# Patient Record
Sex: Male | Born: 1980 | Race: White | Hispanic: No | Marital: Married | Smoking: Former smoker
Health system: Southern US, Community
[De-identification: ages and names within clinical notes are randomized; demographics above are authoritative.]

---

## 2007-10-14 ENCOUNTER — Ambulatory Visit: Payer: Self-pay | Admitting: Internal Medicine

## 2007-11-09 ENCOUNTER — Ambulatory Visit (HOSPITAL_BASED_OUTPATIENT_CLINIC_OR_DEPARTMENT_OTHER): Admission: RE | Admit: 2007-11-09 | Discharge: 2007-11-09 | Payer: Self-pay | Admitting: Internal Medicine

## 2007-11-09 ENCOUNTER — Encounter: Payer: Self-pay | Admitting: Internal Medicine

## 2007-11-12 ENCOUNTER — Ambulatory Visit: Payer: Self-pay | Admitting: Internal Medicine

## 2007-11-23 ENCOUNTER — Ambulatory Visit: Payer: Self-pay | Admitting: Internal Medicine

## 2007-11-23 DIAGNOSIS — G4700 Insomnia, unspecified: Secondary | ICD-10-CM | POA: Insufficient documentation

## 2008-02-17 ENCOUNTER — Ambulatory Visit: Payer: Self-pay | Admitting: Internal Medicine

## 2008-03-08 LAB — CONVERTED CEMR LAB: Free T4: 0.9 ng/dL (ref 0.6–1.6)

## 2008-03-27 ENCOUNTER — Ambulatory Visit (HOSPITAL_BASED_OUTPATIENT_CLINIC_OR_DEPARTMENT_OTHER): Admission: RE | Admit: 2008-03-27 | Discharge: 2008-03-27 | Payer: Self-pay | Admitting: Orthopedic Surgery

## 2008-09-28 ENCOUNTER — Telehealth: Payer: Self-pay | Admitting: Internal Medicine

## 2009-03-04 ENCOUNTER — Telehealth (INDEPENDENT_AMBULATORY_CARE_PROVIDER_SITE_OTHER): Payer: Self-pay | Admitting: *Deleted

## 2009-03-28 ENCOUNTER — Ambulatory Visit: Payer: Self-pay | Admitting: Internal Medicine

## 2010-03-28 ENCOUNTER — Ambulatory Visit: Payer: Self-pay | Admitting: Internal Medicine

## 2010-08-12 NOTE — Procedures (Signed)
NAME:  Brett Rubio, Brett Rubio                 ACCOUNT NO.:  [id number]   MEDICAL RECORD NO.:  [id number]          PATIENT TYPE:  OUT   LOCATION:  SLEEP CENTER                 FACILITY:  [facility]   PHYSICIAN:  Clinton D. Maple Hudson, MD, FCCP, FACPDATE OF BIRTH:  12/08/78   DATE OF STUDY:  11/09/2007                            NOCTURNAL POLYSOMNOGRAM   REFERRING PHYSICIAN:  Clinton D. Maple Hudson, MD, FCCP, FACP   REFERRING PHYSICIAN:  Clinton D. Maple Hudson, M.D.   INDICATION FOR STUDY:  Hypersomnia with sleep apnea.   EPWORTH SLEEPINESS SCORE:  1/24.   BMP 23.7, weight 165 pounds, height 70 inches, neck 15 inches.   MEDICATIONS:  Home medications charted and reviewed.   SLEEP ARCHITECTURE:  Total sleep time 247 minutes with sleep efficiency  62.8%. Stage I was 29.4%, stage 2 was 70.6%, stage 3 and REM were  absent. Sleep latency 24 minutes. Awake after sleep onset 104 minutes.  Arousal index 26.2 indicating increased EEG arousal. The sleep profile  was marked by frequent, brief, nonspecific awakenings throughout the  night. No bedtime medication was taken.   RESPIRATORY DATA:  Apnea/hypopnea index (AHI) 0 per hour, respiratory  disturbance index (RDI) 6.6 per hour indicating minimal respiratory  impact on sleep. Respiratory events with arousals totaled 27 with an  index of 6.6 per hour.   OXYGEN DATA:  No snoring noted by the technician. Oxygen desaturation  nadir 95% with mean oxygen saturation through the study of 96.9% on room  air.   CARDIAC DATA:  Normal sinus rhythm.   MOVEMENT-PARASOMNIA:  No significant movement disturbance. Bathroom x1.   IMPRESSIONS-RECOMMENDATIONS:  1. Sleep profile was significant for frequent, nonspecific waking,      insomnia pattern.  2. Insignificant sleep disturbance related to respiratory or movement      events during sleep.      Clinton D. Maple Hudson, MD, [facility], FACP  Diplomate, Biomedical engineer of Sleep Medicine  Electronically Signed     CDY/MEDQ  D:   11/12/2007 12:49:37  T:  11/12/2007 13:09:43  Job:  [id number]

## 2010-08-12 NOTE — Op Note (Signed)
NAMEKAION, TISDALE                 ACCOUNT NO.:  [id number]   MEDICAL RECORD NO.:  [id number]          PATIENT TYPE:  AMB   LOCATION:  [facility]                         FACILITY:  [facility]   PHYSICIAN:  Deidre Ala, M.D.    DATE OF BIRTH:  1980/10/21   DATE OF PROCEDURE:  03/27/2008  DATE OF DISCHARGE:                               OPERATIVE REPORT   PREOPERATIVE DIAGNOSES:  1. Right knee medial plica syndrome.  2. Left knee medial plica syndrome.   POSTOPERATIVE DIAGNOSES:  1. Right knee medial plica syndrome.  2. Left knee medial plica syndrome.   PROCEDURE:  1. Right knee arthroscopy with excision of medial plica.  2. Left knee arthroscopy with debridement of medial plica.   SURGEON:  1. Charlesetta Shanks, M.D.   ASSISTANT:  Phineas Semen, P.A.   ANESTHESIA:  General with LMA.   CULTURES:  None.   DRAINS:  None.   ESTIMATED BLOOD LOSS:  Minimal.   TOURNIQUET TIME:  Right: 12 minutes.  Left: 12 minutes.   PATHOLOGIC FINDINGS AND HISTORY:  Antuane does a lot of walking and some  running.  He does a lot of lifting and squatting.  He presented to [place]  about 14 months ago with knee pain.  We made the diagnosis of bilateral  plica with mild patella alta bilaterally with tenderness right over  plicas bilaterally.  We injected the knee with cortisone and Marcaine.  We saw him back February 16, 2007.  He has greatly improved with the  injection and so he made good progress, put him on ibuprofen.  By November 18, 2007 he came back in.  He is living with his plicas level V.  They  were still bothering him, not terribly aggravating, but they became more  bothersome over the last several months in the cold with walking.  On  exam today preop he was tender over bilateral plicas.  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  Therefore, at surgery today  on both sides we found sharp crescentic fibrous plica bands exactly  corresponding to his areas of maximal tenderness.  We  debrided those to  a sidewall and lysed the medial band.  There was some gutter synovitis,  mild chondromalacia of patella.  The ACLs looked good.  The menisci  looked good on both sides.  Plicas were resected.   PROCEDURE:  With adequate anesthesia obtained using LMA technique, 1  gram Ancef given IV prophylaxis, the patient was placed in the supine  position.  Both lower extremities were prepped from the malleoli to the  leg holders in the standard fashion.  After standard prepping and  draping on the right knee, Esmarch examination was used.  The tourniquet  was let up to 350 mmHg.  Superior lateral inflow portal was made.  The  knee was insufflated with normal saline with arthroscopic pump.  Medial  and lateral scope portals were then made.  The joint was thoroughly  inspected.  I then shaved the medial plica back to the sidewall and  lysed the medial band.  I then checked the menisci and the ACL, shaved  out the ligamentum mucosa.  I reversed the portals and shaved out the  lateral gutter synovitis and checked the joint surface under the  patella.  The knee was then irrigated through the scope, 0.5% Marcaine  injected on the left knee only.  Right bulky sterile compressive  dressing was applied.  The tourniquet was let down and attention was  turned the left knee where the exact same procedure was performed.  The  patient then had a dressing placed there having procedure well, was  awakened, taken to the recovery room in satisfactory condition.  He will  be discharged per outpatient routine.  Given EZ wraps, Percocet for  pain, crutches and told call the office for recheck tomorrow.      Deidre Ala, M.D.  Electronically Signed     VEP/MEDQ  D:  03/27/2008  T:  03/28/2008  Job:  [id number]

## 2011-01-02 LAB — POCT HEMOGLOBIN-HEMACUE: Hemoglobin: 16.3 g/dL (ref 13.0–17.0)

## 2015-11-10 DIAGNOSIS — K219 Gastro-esophageal reflux disease without esophagitis: Secondary | ICD-10-CM

## 2015-11-10 HISTORY — DX: Gastro-esophageal reflux disease without esophagitis: K21.9

## 2015-11-11 DIAGNOSIS — K5090 Crohn's disease, unspecified, without complications: Secondary | ICD-10-CM | POA: Insufficient documentation

## 2015-11-11 DIAGNOSIS — Z8619 Personal history of other infectious and parasitic diseases: Secondary | ICD-10-CM | POA: Insufficient documentation

## 2015-11-11 HISTORY — DX: Personal history of other infectious and parasitic diseases: Z86.19

## 2017-05-27 DIAGNOSIS — Z6823 Body mass index (BMI) 23.0-23.9, adult: Secondary | ICD-10-CM | POA: Diagnosis not present

## 2017-05-27 DIAGNOSIS — Z13220 Encounter for screening for lipoid disorders: Secondary | ICD-10-CM | POA: Diagnosis not present

## 2017-05-27 DIAGNOSIS — Z131 Encounter for screening for diabetes mellitus: Secondary | ICD-10-CM | POA: Diagnosis not present

## 2017-05-27 DIAGNOSIS — Z0000 Encounter for general adult medical examination without abnormal findings: Secondary | ICD-10-CM | POA: Diagnosis not present

## 2017-05-27 DIAGNOSIS — Z1331 Encounter for screening for depression: Secondary | ICD-10-CM | POA: Diagnosis not present

## 2017-06-09 DIAGNOSIS — R197 Diarrhea, unspecified: Secondary | ICD-10-CM | POA: Diagnosis not present

## 2017-06-22 DIAGNOSIS — K529 Noninfective gastroenteritis and colitis, unspecified: Secondary | ICD-10-CM | POA: Diagnosis not present

## 2017-06-22 DIAGNOSIS — K625 Hemorrhage of anus and rectum: Secondary | ICD-10-CM | POA: Diagnosis not present

## 2017-07-26 DIAGNOSIS — K625 Hemorrhage of anus and rectum: Secondary | ICD-10-CM | POA: Diagnosis not present

## 2017-07-26 DIAGNOSIS — K5289 Other specified noninfective gastroenteritis and colitis: Secondary | ICD-10-CM | POA: Diagnosis not present

## 2017-07-29 DIAGNOSIS — K5289 Other specified noninfective gastroenteritis and colitis: Secondary | ICD-10-CM | POA: Diagnosis not present

## 2018-01-05 DIAGNOSIS — K5100 Ulcerative (chronic) pancolitis without complications: Secondary | ICD-10-CM | POA: Diagnosis not present

## 2018-05-16 DIAGNOSIS — J0100 Acute maxillary sinusitis, unspecified: Secondary | ICD-10-CM | POA: Diagnosis not present

## 2018-05-16 DIAGNOSIS — R030 Elevated blood-pressure reading, without diagnosis of hypertension: Secondary | ICD-10-CM | POA: Diagnosis not present

## 2018-10-24 DIAGNOSIS — K5100 Ulcerative (chronic) pancolitis without complications: Secondary | ICD-10-CM | POA: Diagnosis not present

## 2018-10-24 DIAGNOSIS — K5010 Crohn's disease of large intestine without complications: Secondary | ICD-10-CM | POA: Diagnosis not present

## 2019-08-10 DIAGNOSIS — K51011 Ulcerative (chronic) pancolitis with rectal bleeding: Secondary | ICD-10-CM | POA: Diagnosis not present

## 2019-08-10 DIAGNOSIS — E559 Vitamin D deficiency, unspecified: Secondary | ICD-10-CM | POA: Diagnosis not present

## 2019-08-10 DIAGNOSIS — R7989 Other specified abnormal findings of blood chemistry: Secondary | ICD-10-CM | POA: Diagnosis not present

## 2019-09-01 DIAGNOSIS — Z1159 Encounter for screening for other viral diseases: Secondary | ICD-10-CM | POA: Diagnosis not present

## 2019-09-04 DIAGNOSIS — R899 Unspecified abnormal finding in specimens from other organs, systems and tissues: Secondary | ICD-10-CM | POA: Diagnosis not present

## 2019-09-06 DIAGNOSIS — K648 Other hemorrhoids: Secondary | ICD-10-CM | POA: Diagnosis not present

## 2019-09-06 DIAGNOSIS — K5100 Ulcerative (chronic) pancolitis without complications: Secondary | ICD-10-CM | POA: Diagnosis not present

## 2019-09-06 DIAGNOSIS — K644 Residual hemorrhoidal skin tags: Secondary | ICD-10-CM | POA: Diagnosis not present

## 2019-09-06 DIAGNOSIS — K5289 Other specified noninfective gastroenteritis and colitis: Secondary | ICD-10-CM | POA: Diagnosis not present

## 2019-09-06 LAB — HM COLONOSCOPY

## 2019-09-07 ENCOUNTER — Other Ambulatory Visit: Payer: Self-pay | Admitting: Gastroenterology

## 2019-09-07 DIAGNOSIS — R945 Abnormal results of liver function studies: Secondary | ICD-10-CM

## 2019-09-07 DIAGNOSIS — R7989 Other specified abnormal findings of blood chemistry: Secondary | ICD-10-CM

## 2019-09-15 ENCOUNTER — Ambulatory Visit
Admission: RE | Admit: 2019-09-15 | Discharge: 2019-09-15 | Disposition: A | Discharge status: Home / Self Care | Payer: BC Managed Care – PPO | Source: Ambulatory Visit | Attending: Gastroenterology | Admitting: Gastroenterology

## 2019-09-15 DIAGNOSIS — R7989 Other specified abnormal findings of blood chemistry: Secondary | ICD-10-CM

## 2019-09-15 DIAGNOSIS — K760 Fatty (change of) liver, not elsewhere classified: Secondary | ICD-10-CM | POA: Diagnosis not present

## 2019-09-15 DIAGNOSIS — R945 Abnormal results of liver function studies: Secondary | ICD-10-CM

## 2019-10-06 DIAGNOSIS — R7989 Other specified abnormal findings of blood chemistry: Secondary | ICD-10-CM | POA: Diagnosis not present

## 2019-10-20 DIAGNOSIS — Z23 Encounter for immunization: Secondary | ICD-10-CM | POA: Diagnosis not present

## 2019-10-20 DIAGNOSIS — F900 Attention-deficit hyperactivity disorder, predominantly inattentive type: Secondary | ICD-10-CM | POA: Diagnosis not present

## 2019-10-20 DIAGNOSIS — F1010 Alcohol abuse, uncomplicated: Secondary | ICD-10-CM | POA: Diagnosis not present

## 2019-10-20 DIAGNOSIS — R41840 Attention and concentration deficit: Secondary | ICD-10-CM | POA: Diagnosis not present

## 2019-11-20 DIAGNOSIS — Z03818 Encounter for observation for suspected exposure to other biological agents ruled out: Secondary | ICD-10-CM | POA: Diagnosis not present

## 2019-11-20 DIAGNOSIS — Z20822 Contact with and (suspected) exposure to covid-19: Secondary | ICD-10-CM | POA: Diagnosis not present

## 2019-12-10 ENCOUNTER — Other Ambulatory Visit: Payer: Self-pay

## 2019-12-10 ENCOUNTER — Ambulatory Visit: Payer: BC Managed Care – PPO | Attending: Internal Medicine

## 2019-12-10 DIAGNOSIS — Z23 Encounter for immunization: Secondary | ICD-10-CM

## 2019-12-10 NOTE — Progress Notes (Signed)
   Covid-19 Vaccination Clinic  Name:  Brett Rubio    MRN: [id number] DOB: 01-Feb-1981  12/10/2019  Brett Rubio was observed post Covid-19 immunization for 15 minutes without incident. He was provided with Vaccine Information Sheet and instruction to access the V-Safe system.   Brett Rubio was instructed to call 911 with any severe reactions post vaccine: Marland Kitchen Difficulty breathing  . Swelling of face and throat  . A fast heartbeat  . A bad rash all over body  . Dizziness and weakness

## 2020-02-05 DIAGNOSIS — J0100 Acute maxillary sinusitis, unspecified: Secondary | ICD-10-CM | POA: Diagnosis not present

## 2020-05-29 DIAGNOSIS — K760 Fatty (change of) liver, not elsewhere classified: Secondary | ICD-10-CM | POA: Diagnosis not present

## 2020-05-29 DIAGNOSIS — K5100 Ulcerative (chronic) pancolitis without complications: Secondary | ICD-10-CM | POA: Diagnosis not present

## 2020-06-26 DIAGNOSIS — R945 Abnormal results of liver function studies: Secondary | ICD-10-CM | POA: Diagnosis not present

## 2020-06-26 DIAGNOSIS — R718 Other abnormality of red blood cells: Secondary | ICD-10-CM | POA: Diagnosis not present

## 2020-06-26 DIAGNOSIS — R7989 Other specified abnormal findings of blood chemistry: Secondary | ICD-10-CM | POA: Diagnosis not present

## 2020-09-11 DIAGNOSIS — Z20822 Contact with and (suspected) exposure to covid-19: Secondary | ICD-10-CM | POA: Diagnosis not present

## 2020-09-12 DIAGNOSIS — U071 COVID-19: Secondary | ICD-10-CM | POA: Diagnosis not present

## 2020-09-12 DIAGNOSIS — R059 Cough, unspecified: Secondary | ICD-10-CM | POA: Diagnosis not present

## 2020-09-12 DIAGNOSIS — J029 Acute pharyngitis, unspecified: Secondary | ICD-10-CM | POA: Diagnosis not present

## 2020-09-12 DIAGNOSIS — R0981 Nasal congestion: Secondary | ICD-10-CM | POA: Diagnosis not present

## 2020-10-23 ENCOUNTER — Other Ambulatory Visit: Payer: Self-pay | Admitting: Gastroenterology

## 2020-10-23 DIAGNOSIS — K5100 Ulcerative (chronic) pancolitis without complications: Secondary | ICD-10-CM

## 2020-10-23 DIAGNOSIS — R634 Abnormal weight loss: Secondary | ICD-10-CM | POA: Diagnosis not present

## 2020-10-23 DIAGNOSIS — R112 Nausea with vomiting, unspecified: Secondary | ICD-10-CM | POA: Diagnosis not present

## 2020-10-23 DIAGNOSIS — K760 Fatty (change of) liver, not elsewhere classified: Secondary | ICD-10-CM | POA: Diagnosis not present

## 2020-11-06 DIAGNOSIS — R945 Abnormal results of liver function studies: Secondary | ICD-10-CM | POA: Diagnosis not present

## 2020-11-09 DIAGNOSIS — B370 Candidal stomatitis: Secondary | ICD-10-CM | POA: Diagnosis not present

## 2020-11-09 DIAGNOSIS — R030 Elevated blood-pressure reading, without diagnosis of hypertension: Secondary | ICD-10-CM | POA: Diagnosis not present

## 2020-11-09 DIAGNOSIS — J029 Acute pharyngitis, unspecified: Secondary | ICD-10-CM | POA: Diagnosis not present

## 2020-11-25 ENCOUNTER — Ambulatory Visit
Admission: RE | Admit: 2020-11-25 | Discharge: 2020-11-25 | Disposition: A | Discharge status: Home / Self Care | Payer: BC Managed Care – PPO | Source: Ambulatory Visit | Attending: Gastroenterology | Admitting: Gastroenterology

## 2020-11-25 ENCOUNTER — Other Ambulatory Visit: Payer: Self-pay

## 2020-11-25 DIAGNOSIS — N3289 Other specified disorders of bladder: Secondary | ICD-10-CM | POA: Diagnosis not present

## 2020-11-25 DIAGNOSIS — K5100 Ulcerative (chronic) pancolitis without complications: Secondary | ICD-10-CM | POA: Diagnosis not present

## 2020-11-25 DIAGNOSIS — K760 Fatty (change of) liver, not elsewhere classified: Secondary | ICD-10-CM | POA: Diagnosis not present

## 2020-11-25 DIAGNOSIS — K5190 Ulcerative colitis, unspecified, without complications: Secondary | ICD-10-CM | POA: Diagnosis not present

## 2020-11-25 MED ORDER — IOPAMIDOL (ISOVUE-300) INJECTION 61%
100.0000 mL | Freq: Once | INTRAVENOUS | Status: AC | PRN
  Administered 2020-11-25: 100 mL via INTRAVENOUS

## 2020-12-06 DIAGNOSIS — R899 Unspecified abnormal finding in specimens from other organs, systems and tissues: Secondary | ICD-10-CM | POA: Diagnosis not present

## 2020-12-30 DIAGNOSIS — R899 Unspecified abnormal finding in specimens from other organs, systems and tissues: Secondary | ICD-10-CM | POA: Diagnosis not present

## 2020-12-30 DIAGNOSIS — H16012 Central corneal ulcer, left eye: Secondary | ICD-10-CM | POA: Diagnosis not present

## 2020-12-30 DIAGNOSIS — H179 Unspecified corneal scar and opacity: Secondary | ICD-10-CM | POA: Diagnosis not present

## 2021-01-02 DIAGNOSIS — Z125 Encounter for screening for malignant neoplasm of prostate: Secondary | ICD-10-CM | POA: Diagnosis not present

## 2021-01-02 DIAGNOSIS — N3289 Other specified disorders of bladder: Secondary | ICD-10-CM | POA: Diagnosis not present

## 2021-01-06 DIAGNOSIS — H179 Unspecified corneal scar and opacity: Secondary | ICD-10-CM | POA: Diagnosis not present

## 2021-01-17 ENCOUNTER — Other Ambulatory Visit: Payer: Self-pay | Admitting: Ophthalmology

## 2021-01-17 DIAGNOSIS — H4922 Sixth [abducent] nerve palsy, left eye: Secondary | ICD-10-CM

## 2021-02-06 DIAGNOSIS — H179 Unspecified corneal scar and opacity: Secondary | ICD-10-CM | POA: Diagnosis not present

## 2021-02-24 DIAGNOSIS — H179 Unspecified corneal scar and opacity: Secondary | ICD-10-CM | POA: Diagnosis not present

## 2021-04-04 DIAGNOSIS — H179 Unspecified corneal scar and opacity: Secondary | ICD-10-CM | POA: Diagnosis not present

## 2021-05-20 DIAGNOSIS — H179 Unspecified corneal scar and opacity: Secondary | ICD-10-CM | POA: Diagnosis not present

## 2021-07-14 DIAGNOSIS — R112 Nausea with vomiting, unspecified: Secondary | ICD-10-CM | POA: Diagnosis not present

## 2021-07-14 DIAGNOSIS — R748 Abnormal levels of other serum enzymes: Secondary | ICD-10-CM | POA: Diagnosis not present

## 2021-07-14 DIAGNOSIS — K700 Alcoholic fatty liver: Secondary | ICD-10-CM | POA: Diagnosis not present

## 2021-07-14 DIAGNOSIS — K5190 Ulcerative colitis, unspecified, without complications: Secondary | ICD-10-CM | POA: Diagnosis not present

## 2021-09-23 DIAGNOSIS — H179 Unspecified corneal scar and opacity: Secondary | ICD-10-CM | POA: Diagnosis not present

## 2021-11-03 DIAGNOSIS — G4089 Other seizures: Secondary | ICD-10-CM | POA: Diagnosis not present

## 2021-11-03 DIAGNOSIS — F319 Bipolar disorder, unspecified: Secondary | ICD-10-CM | POA: Diagnosis not present

## 2021-11-03 DIAGNOSIS — Z8616 Personal history of COVID-19: Secondary | ICD-10-CM | POA: Diagnosis not present

## 2021-11-03 DIAGNOSIS — G4700 Insomnia, unspecified: Secondary | ICD-10-CM | POA: Diagnosis not present

## 2021-11-03 DIAGNOSIS — F1020 Alcohol dependence, uncomplicated: Secondary | ICD-10-CM | POA: Diagnosis not present

## 2021-11-03 DIAGNOSIS — K5090 Crohn's disease, unspecified, without complications: Secondary | ICD-10-CM | POA: Diagnosis not present

## 2021-11-03 DIAGNOSIS — F329 Major depressive disorder, single episode, unspecified: Secondary | ICD-10-CM | POA: Diagnosis not present

## 2021-11-03 DIAGNOSIS — B009 Herpesviral infection, unspecified: Secondary | ICD-10-CM | POA: Diagnosis not present

## 2021-11-03 DIAGNOSIS — R030 Elevated blood-pressure reading, without diagnosis of hypertension: Secondary | ICD-10-CM | POA: Diagnosis not present

## 2021-11-03 DIAGNOSIS — F419 Anxiety disorder, unspecified: Secondary | ICD-10-CM | POA: Diagnosis not present

## 2021-11-03 DIAGNOSIS — Z87820 Personal history of traumatic brain injury: Secondary | ICD-10-CM | POA: Diagnosis not present

## 2021-11-03 DIAGNOSIS — R7401 Elevation of levels of liver transaminase levels: Secondary | ICD-10-CM | POA: Diagnosis not present

## 2021-11-12 DIAGNOSIS — K5190 Ulcerative colitis, unspecified, without complications: Secondary | ICD-10-CM | POA: Diagnosis not present

## 2021-11-12 DIAGNOSIS — F1010 Alcohol abuse, uncomplicated: Secondary | ICD-10-CM | POA: Diagnosis not present

## 2021-11-26 ENCOUNTER — Encounter: Payer: Self-pay | Admitting: Internal Medicine

## 2021-11-26 ENCOUNTER — Ambulatory Visit: Payer: BC Managed Care – PPO | Admitting: Internal Medicine

## 2021-11-26 VITALS — BP 114/78 | HR 57 | Temp 97.7°F | Resp 14 | Ht 69.0 in | Wt 161.0 lb

## 2021-11-26 DIAGNOSIS — F10982 Alcohol use, unspecified with alcohol-induced sleep disorder: Secondary | ICD-10-CM | POA: Diagnosis not present

## 2021-11-26 DIAGNOSIS — F10282 Alcohol dependence with alcohol-induced sleep disorder: Secondary | ICD-10-CM | POA: Diagnosis not present

## 2021-11-26 DIAGNOSIS — Z0000 Encounter for general adult medical examination without abnormal findings: Secondary | ICD-10-CM

## 2021-11-26 DIAGNOSIS — Z119 Encounter for screening for infectious and parasitic diseases, unspecified: Secondary | ICD-10-CM | POA: Diagnosis not present

## 2021-11-26 DIAGNOSIS — F1020 Alcohol dependence, uncomplicated: Secondary | ICD-10-CM

## 2021-11-26 DIAGNOSIS — F1091 Alcohol use, unspecified, in remission: Secondary | ICD-10-CM | POA: Insufficient documentation

## 2021-11-26 HISTORY — DX: Alcohol dependence, uncomplicated: F10.20

## 2021-11-26 LAB — LIPID PANEL
Cholesterol: 188 mg/dL (ref 0–200)
HDL: 51.2 mg/dL (ref >39.00)
LDL Cholesterol: 123 mg/dL — ABNORMAL HIGH (ref 0–99)
NonHDL: 136.84
Total CHOL/HDL Ratio: 4
Triglycerides: 67 mg/dL (ref 0.0–149.0)
VLDL: 13.4 mg/dL (ref 0.0–40.0)

## 2021-11-26 LAB — COMPREHENSIVE METABOLIC PANEL
ALT: 15 U/L (ref 0–53)
AST: 19 U/L (ref 0–37)
Albumin: 4.5 g/dL (ref 3.5–5.2)
Alkaline Phosphatase: 74 U/L (ref 39–117)
BUN: 10 mg/dL (ref 6–23)
CO2: 26 mEq/L (ref 19–32)
Calcium: 10 mg/dL (ref 8.4–10.5)
Chloride: 102 mEq/L (ref 96–112)
Creatinine, Ser: 0.97 mg/dL (ref 0.40–1.50)
GFR: 95.96 mL/min (ref >60.00)
Glucose, Bld: 80 mg/dL (ref 70–99)
Potassium: 4.2 mEq/L (ref 3.5–5.1)
Sodium: 137 mEq/L (ref 135–145)
Total Bilirubin: 0.6 mg/dL (ref 0.2–1.2)
Total Protein: 7.7 g/dL (ref 6.0–8.3)

## 2021-11-26 LAB — CBC WITH DIFFERENTIAL/PLATELET
Basophils Absolute: 0.1 10*3/uL (ref 0.0–0.1)
Basophils Relative: 3 % (ref 0.0–3.0)
Eosinophils Absolute: 0.1 10*3/uL (ref 0.0–0.7)
Eosinophils Relative: 3 % (ref 0.0–5.0)
HCT: 42.5 % (ref 39.0–52.0)
Hemoglobin: 14.6 g/dL (ref 13.0–17.0)
Lymphocytes Relative: 19.4 % (ref 12.0–46.0)
Lymphs Abs: 1 10*3/uL (ref 0.7–4.0)
MCHC: 34.3 g/dL (ref 30.0–36.0)
MCV: 102.3 fl — ABNORMAL HIGH (ref 78.0–100.0)
Monocytes Absolute: 0.5 10*3/uL (ref 0.1–1.0)
Monocytes Relative: 10.7 % (ref 3.0–12.0)
Neutro Abs: 3.2 10*3/uL (ref 1.4–7.7)
Neutrophils Relative %: 63.9 % (ref 43.0–77.0)
Platelets: 335 10*3/uL (ref 150.0–400.0)
RBC: 4.16 Mil/uL — ABNORMAL LOW (ref 4.22–5.81)
RDW: 12.4 % (ref 11.5–15.5)
WBC: 4.9 10*3/uL (ref 4.0–10.5)

## 2021-11-26 MED ORDER — GABAPENTIN 300 MG PO CAPS: 300.0000 mg | ORAL_CAPSULE | Freq: Three times a day (TID) | ORAL | 3 refills | Status: DC

## 2021-11-26 MED ORDER — TRAZODONE HCL 50 MG PO TABS: 50.0000 mg | ORAL_TABLET | Freq: Every evening | ORAL | 3 refills | Status: DC | PRN

## 2021-11-26 NOTE — Patient Instructions (Signed)
It was a pleasure seeing you today!  Today the plan is...  Preventative health care -     CBC with Differential/Platelet -     Comprehensive metabolic panel -     Lipid panel  Screening examination for infectious disease -     HIV Antibody (routine testing w rflx) -     Hepatitis C antibody  Alcohol dependence with alcohol-induced sleep disorder [facility]) Assessment & Plan: Chronic, stable in remission Discussed need for maintenance therapy Advised participate in 12 steps, and try to get a sponsor Uses trazodone for sleep offfered and discussed:  gabapentin  campral  naltrexone had nausea.  disulfiram   Orders: -     Gabapentin; Take 1 capsule (300 mg total) by mouth 3 (three) times daily.  Dispense: 90 capsule; Refill: 3 -     traZODone HCl; Take 1 tablet (50 mg total) by mouth at bedtime as needed.  Dispense: 90 tablet; Refill: 3  Alcohol-induced insomnia (HCC) Assessment & Plan: rf'd trazodone at patient request Chronic stable       Brett Olszewski, MD   Return in about 1 month (around 12/27/2021) for review response to recent changes.    - Please bring all your medicines to your next appointment. This is the best way for me to know exactly what you're taking.  - If your condition begins to worsen or become severe:  go to the ER. - If your condition fails to resolve or you have other questions / concerns: please contact me via phone [phone] or MyChart messaging.     IF you received an x-ray today, you will receive an invoice from [facility]. Please contact [facility] at [phone] with questions or concerns regarding your invoice.    IF you received labwork today, you will receive an invoice from [facility]. Please contact [facility] at [phone] with questions or concerns regarding your invoice.    Our billing staff will not be able to assist you with questions regarding bills from these companies.   You will be contacted with the lab  results as soon as they are available. The fastest way to get your results is to activate your My Chart account. Instructions are located on the last page of this paperwork. If you have not heard from [place] regarding the results in 2 weeks, please contact this office. For any labs or imaging tests, we will call you if the results are significantly abnormal.  Most normal results will be posted to myChart as soon as they are available and I will comment on them there within 2-3 business days.

## 2021-11-26 NOTE — Progress Notes (Signed)
Today's healthcare provider: Lula Olszewski, MD  Phone: [phone]  New patient visit  Visit Date: 11/26/2021 Patient: Brett Rubio   DOB: Mar 08, 1981   42 y.o. Male  MRN: [id number]  Assessment and Plan:   Arius was seen today for establish care.  Preventative health care -     CBC with Differential/Platelet -     Comprehensive metabolic panel -     Lipid panel  Screening examination for infectious disease -     HIV Antibody (routine testing w rflx) -     Hepatitis C antibody  Alcohol dependence with alcohol-induced sleep disorder (HCC) Overview: History of delirium tremens Did medical detox with librium/trazodone, at [facility]. Not a fan of 12 steps   Assessment & Plan: Chronic, stable in remission Discussed need for maintenance therapy Advised participate in 12 steps, and try to get a sponsor Uses trazodone for sleep offfered and discussed:  gabapentin  campral  naltrexone had nausea.  disulfiram   Orders: -     Gabapentin; Take 1 capsule (300 mg total) by mouth 3 (three) times daily.  Dispense: 90 capsule; Refill: 3 -     traZODone HCl; Take 1 tablet (50 mg total) by mouth at bedtime as needed.  Dispense: 90 tablet; Refill: 3  Alcohol-induced insomnia (HCC) Overview: Alcohol related Trazodone helps a lot Major source of triggering for alcohol use   Assessment & Plan: rf'd trazodone at patient request Chronic stable        Health Maintenance  Topic Date Due   HIV Screening  Never done   Hepatitis C Screening  Never done   TETANUS/TDAP  Never done   COVID-19 Vaccine  Completed   HPV VACCINES  Aged Out   INFLUENZA VACCINE  Discontinued       Recommended follow up: Return in about 1 month (around 12/27/2021) for review response to recent changes.      Subjective:  Patient presents today to establish care.  Comes to establish primary care as part of recommendations from recent rehab.  Needs med rf's too.   Has stayed sober ever since  leaving 3 weeks ago- 5 day rehab for alcohol with history of d/t.  Chief Complaint  Patient presents with   Establish Care    Wants to have medication filled, was in a detox program three weeks ago. Fasting today.    For history taking, I took a per problem history from the patient and chart review as follows: Problem  Alcohol Dependence (Hcc)   History of delirium tremens Did medical detox with librium/trazodone, at [facility]. Not a fan of 12 steps    INSOMNIA   Alcohol related Trazodone helps a lot Major source of triggering for alcohol use       Depression Screen    11/26/2021    9:08 AM  PHQ 2/9 Scores  PHQ - 2 Score 0   No results found for any visits on 11/26/21.   The following were reviewed and entered/updated in epic: Past Medical History:  Diagnosis Date   Alcohol dependence (HCC) 11/26/2021   History of delirium tremens Did medical detox with librium/trazodone, at [facility]. Not a fan of 12 steps    History reviewed. No pertinent surgical history. History reviewed. No pertinent surgical history. Family Status  Relation Name Status   Mother Dawn Alive   Father Wallie Char   Brother Heath Alive   Family History  Problem Relation Age of Onset   Cancer Father  Alcohol abuse Brother    Outpatient Medications Prior to Visit  Medication Sig Dispense Refill   Mesalamine 800 MG TBEC Take by mouth.     Multiple Vitamins-Minerals (MULTIVITAMIN ADULTS PO) Take 1 tablet by mouth daily.     valACYclovir (VALTREX) 500 MG tablet Take 500 mg by mouth every morning.     traZODone (DESYREL) 50 MG tablet Take 50 mg by mouth at bedtime as needed.     No facility-administered medications prior to visit.    Allergies  Allergen Reactions   Sulfa Antibiotics Nausea And Vomiting    REACTION: nausea   Erythromycin     REACTION: nausea   Sulfonamide Derivatives     REACTION: nausea   Social History   Tobacco Use   Smoking status: Former    Types:  Cigarettes   Smokeless tobacco: Never  Vaping Use   Vaping Use: Never used  Substance Use Topics   Alcohol use: Not Currently    Comment: Formerly-15+ glasses of wine, 30+ cans of beer   Drug use: Not Currently    Immunization History  Administered Date(s) Administered   PFIZER(Purple Top)SARS-COV-2 Vaccination 12/10/2019   Pfizer Covid-19 Vaccine Bivalent Booster 68yrs & up 12/07/2020      Objective:  BP 114/78 (BP Location: Right Arm, Patient Position: Sitting)   Pulse (!) 57   Temp 97.7 F (36.5 C) (Temporal)   Resp 14   Ht 5\' 9"  (1.753 m)   Wt 161 lb (73 kg)   SpO2 99%   BMI 23.78 kg/m  Body mass index is 23.78 kg/m.  He  is a very cordial and polite person who was a pleasure to meet.  His head is shaved Gen: NAD, resting comfortably HEENT: Mucous membranes are moist. Sclera conjunctiva and lids grossly normal Neck: no thyromegaly, no cervical lymphadenopathy CV: RRR no murmurs rubs or gallops Lungs: CTAB no crackles, wheeze, rhonchiExt: no edema Skin: warm, dry Neuro: grossly intact  No images are attached to the encounter or orders placed in the encounter.  Results for orders placed or performed during the hospital encounter of 03/27/08  Hemoglobin-hemacue, POC  Result Value Ref Range   Hemoglobin 16.3 13.0 - 17.0 g/dL

## 2021-11-26 NOTE — Assessment & Plan Note (Signed)
Chronic, stable in remission Discussed need for maintenance therapy Advised participate in 12 steps, and try to get a sponsor Uses trazodone for sleep offfered and discussed:  gabapentin  campral  naltrexone had nausea.  disulfiram

## 2021-11-26 NOTE — Assessment & Plan Note (Signed)
rf'd trazodone at patient request Chronic stable

## 2021-11-27 LAB — HEPATITIS C ANTIBODY: Hepatitis C Ab: NONREACTIVE

## 2021-11-27 LAB — HIV ANTIBODY (ROUTINE TESTING W REFLEX): HIV 1&2 Ab, 4th Generation: NONREACTIVE

## 2021-11-27 NOTE — Progress Notes (Signed)
Labs suggest vitamin deficiency. Recommend b-complex multivitamin daily that contains B1, B6, and B12 and preferably minerals and other vitamins as well but the b-vitamins are the most important deficiency

## 2021-11-28 NOTE — Progress Notes (Signed)
Hiv/hep c neg notify (ok to wait until other labs availabe to call with all)

## 2021-12-17 DIAGNOSIS — K5190 Ulcerative colitis, unspecified, without complications: Secondary | ICD-10-CM | POA: Diagnosis not present

## 2021-12-17 DIAGNOSIS — F1010 Alcohol abuse, uncomplicated: Secondary | ICD-10-CM | POA: Diagnosis not present

## 2021-12-29 ENCOUNTER — Ambulatory Visit: Payer: BC Managed Care – PPO | Admitting: Internal Medicine

## 2021-12-29 ENCOUNTER — Encounter: Payer: Self-pay | Admitting: Internal Medicine

## 2021-12-29 VITALS — BP 108/67 | HR 77 | Temp 97.2°F | Ht 69.0 in | Wt 166.0 lb

## 2021-12-29 DIAGNOSIS — Z8659 Personal history of other mental and behavioral disorders: Secondary | ICD-10-CM

## 2021-12-29 DIAGNOSIS — F10282 Alcohol dependence with alcohol-induced sleep disorder: Secondary | ICD-10-CM

## 2021-12-29 DIAGNOSIS — R972 Elevated prostate specific antigen [PSA]: Secondary | ICD-10-CM

## 2021-12-29 DIAGNOSIS — Z8042 Family history of malignant neoplasm of prostate: Secondary | ICD-10-CM

## 2021-12-29 DIAGNOSIS — F39 Unspecified mood [affective] disorder: Secondary | ICD-10-CM | POA: Diagnosis not present

## 2021-12-29 DIAGNOSIS — F32A Depression, unspecified: Secondary | ICD-10-CM

## 2021-12-29 DIAGNOSIS — F341 Dysthymic disorder: Secondary | ICD-10-CM

## 2021-12-29 DIAGNOSIS — F329 Major depressive disorder, single episode, unspecified: Secondary | ICD-10-CM | POA: Insufficient documentation

## 2021-12-29 DIAGNOSIS — E569 Vitamin deficiency, unspecified: Secondary | ICD-10-CM | POA: Diagnosis not present

## 2021-12-29 HISTORY — DX: Personal history of other mental and behavioral disorders: Z86.59

## 2021-12-29 HISTORY — DX: Dysthymic disorder: F34.1

## 2021-12-29 HISTORY — DX: Elevated prostate specific antigen (PSA): R97.20

## 2021-12-29 LAB — CBC WITH DIFFERENTIAL/PLATELET
Basophils Absolute: 0.1 10*3/uL (ref 0.0–0.1)
Basophils Relative: 1.2 % (ref 0.0–3.0)
Eosinophils Absolute: 0.1 10*3/uL (ref 0.0–0.7)
Eosinophils Relative: 1.8 % (ref 0.0–5.0)
HCT: 41.9 % (ref 39.0–52.0)
Hemoglobin: 14.4 g/dL (ref 13.0–17.0)
Lymphocytes Relative: 16.7 % (ref 12.0–46.0)
Lymphs Abs: 1.2 10*3/uL (ref 0.7–4.0)
MCHC: 34.3 g/dL (ref 30.0–36.0)
MCV: 100.5 fl — ABNORMAL HIGH (ref 78.0–100.0)
Monocytes Absolute: 0.6 10*3/uL (ref 0.1–1.0)
Monocytes Relative: 8.1 % (ref 3.0–12.0)
Neutro Abs: 5.4 10*3/uL (ref 1.4–7.7)
Neutrophils Relative %: 72.2 % (ref 43.0–77.0)
Platelets: 314 10*3/uL (ref 150.0–400.0)
RBC: 4.17 Mil/uL — ABNORMAL LOW (ref 4.22–5.81)
RDW: 12.5 % (ref 11.5–15.5)
WBC: 7.4 10*3/uL (ref 4.0–10.5)

## 2021-12-29 LAB — PSA: PSA: 2.37 ng/mL (ref 0.10–4.00)

## 2021-12-29 MED ORDER — ACAMPROSATE CALCIUM 333 MG PO TBEC: 666.0000 mg | DELAYED_RELEASE_TABLET | Freq: Three times a day (TID) | ORAL | 3 refills | Status: DC

## 2021-12-29 NOTE — Assessment & Plan Note (Signed)
He cannot take anything that lowers his libido due to his wife and he has a history of bad reaction to Wellbutrin yet he remains very depressed and stressed and it is contributing to his recurring alcohol use so I am going to try to get Vraylar even though it is a bit early he does report he has been very depressed for very long time

## 2021-12-29 NOTE — Assessment & Plan Note (Addendum)
Mostly in remission, with rare small volume Offered to adjust gabapentin,

## 2021-12-29 NOTE — Patient Instructions (Addendum)
It was a pleasure seeing you today!  Today the plan is...  Mood disorder ([name]) -     Ambulatory referral to Psychiatry -     Ambulatory referral to Psychology  Vitamin deficiency -     CBC with Differential/Platelet  Alcohol dependence with alcohol-induced sleep disorder [facility]) Assessment & Plan: Mostly in remission, with rare small volume Offered to adjust gabapentin,  Orders: -     Acamprosate Calcium; Take 2 tablets (666 mg total) by mouth 3 (three) times daily with meals. As needed only, to relieve stress and reduce alcohol craving  Dispense: 180 tablet; Refill: 3 -     Ambulatory referral to Psychiatry -     Ambulatory referral to Psychology  History of ADHD -     Ambulatory referral to Psychiatry -     Ambulatory referral to Psychology  Abnormal PSA -     PSA  Family history of prostate cancer -     PSA  Depressive disorder Assessment & Plan: He cannot take anything that lowers his libido due to his wife and he has a history of bad reaction to Wellbutrin yet he remains very depressed and stressed and it is contributing to his recurring alcohol use so I am going to try to get Vraylar even though it is a bit early he does report he has been very depressed for very long time   Persistent depressive disorder with melancholic features, currently severe Assessment & Plan: He cannot take anything that lowers his libido due to his wife and he has a history of bad reaction to Wellbutrin yet he remains very depressed and stressed and it is contributing to his recurring alcohol use so I am going to try to get Vraylar even though it is a bit early he does report he has been very depressed for very long time      Brett Pacas, MD   No follow-ups on file.   - If your condition fails to resolve or you have other questions / concerns: please contact me via phone [phone] or MyChart messaging.  - Please bring all your medicines to your next appointment. This is the best  way for me to know exactly what you're taking.  - If your condition begins to worsen or become severe:  go to the ER.   IF you received an x-ray today, you will receive an invoice from [facility]. Please contact [facility] at [phone] with questions or concerns regarding your invoice.    IF you received labwork today, you will receive an invoice from [facility]. Please contact [facility] at [phone] with questions or concerns regarding your invoice.    Our billing staff will not be able to assist you with questions regarding bills from these companies.   --------------------------------------------------------------------------------------------------------------------  You will be contacted with the lab results as soon as they are available. The fastest way to get your results is to activate your My Chart account. Instructions are located on the last page of this paperwork. If you have not heard from [place] regarding the results in 2 weeks, please contact this office. For any labs or imaging tests, we will call you if the results are significantly abnormal.  Most normal results will be posted to myChart as soon as they are available and I will comment on them there within 2-3 business days.

## 2021-12-29 NOTE — Progress Notes (Signed)
[place] at [employer]:  [phone]   Routine Medical Office Visit  Patient:  Brett Rubio      Age: 41 y.o.       Sex:  male  Date:   12/29/2021  PCP:    Loralee Pacas, MD    [place] Provider: Loralee Pacas, MD  Assessment/Plan:   [name] was seen today for 1 month follow-up and vitamin defieciency.  Mood disorder ([place]) -     Ambulatory referral to Psychiatry -     Ambulatory referral to Psychology  Vitamin deficiency -     CBC with Differential/Platelet  Alcohol dependence with alcohol-induced sleep disorder ([place]) Overview: History of delirium tremens Did medical detox with librium/trazodone, [facility]. Not a fan of 12 steps Commitment to recovery is for his family Gabapentin helpful   Assessment & Plan: Mostly in remission, with rare small volume Offered to adjust gabapentin,  Orders: -     Acamprosate Calcium; Take 2 tablets (666 mg total) by mouth 3 (three) times daily with meals. As needed only, to relieve stress and reduce alcohol craving  Dispense: 180 tablet; Refill: 3 -     Ambulatory referral to Psychiatry -     Ambulatory referral to Psychology  History of ADHD -     Ambulatory referral to Psychiatry -     Ambulatory referral to Psychology  Abnormal PSA -     PSA  Family history of prostate cancer -     PSA  Depressive disorder Assessment & Plan: He cannot take anything that lowers his libido due to his wife and he has a history of bad reaction to Wellbutrin yet he remains very depressed and stressed and it is contributing to his recurring alcohol use so I am going to try to get Vraylar even though it is a bit early he does report he has been very depressed for very long time   Persistent depressive disorder with melancholic features, currently severe Assessment & Plan: He cannot take anything that lowers his libido due to his wife and he has a history of bad reaction to Wellbutrin yet he remains very  depressed and stressed and it is contributing to his recurring alcohol use so I am going to try to get Vraylar even though it is a bit early he does report he has been very depressed for very long time      Return in about 1 month (around 01/29/2022) for review response to recent changes.   Common side effects, risks, benefits, and alternatives for medications and treatment plan prescribed today were discussed, and he expressed understanding of the given instructions.  Medication list was reconciled and provided to the patient in the AVS. Patient instructions and summary information was reviewed with him as documented in the AVS.  Patient is instructed to call or message via MyChart if he has any questions or concerns regarding our treatment plan.  No barriers to understanding were identified.  Additional information was provided in the AVS (see AVS) regarding the diagnosis/treatment plan for him to review    Subjective:   Brett Rubio is a 41 y.o. male with PMH significant for: Past Medical History:  Diagnosis Date   Alcohol dependence ([place]) 11/26/2021   History of delirium tremens Did medical detox with librium/trazodone, at [facility]. 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     Additional physician collected history: Feels wells staying sober e/c just a few drinks a/w stress Positive for: Family supporting Taking his vitamins Negative for: 12 step participation History of adhd would like to d/w psych Feels he is depressed,would like to             Objective:  Physical Exam: BP 108/67 (BP Location: Left Arm, Patient Position: Sitting)   Pulse 77   Temp (!) 97.2 F (36.2 C) (Temporal)   Ht 5' 9" (1.753 m)   Wt 166 lb (75.3 kg)   SpO2 98%   BMI 24.51 kg/m   He  is a polite, friendly, and genuine  person Constitutional: NAD, AAO, not ill-appearing Neuro: alert, no focal deficit obvious, articulate speech Psych: normal mood, behavior, thought content   Problem specific physical exam findings:  None, calm  No images are attached to the encounter or orders placed in the encounter.    Results:  No results found for any visits on 12/29/21.   Recent Results (from the past 2160 hour(s))  CBC w/Diff     Status: Abnormal   Collection Time: 11/26/21  9:39 AM  Result Value Ref Range   WBC 4.9 4.0 - 10.5 K/uL   RBC 4.16 (L) 4.22 - 5.81 Mil/uL   Hemoglobin 14.6 13.0 - 17.0 g/dL   HCT 42.5 39.0 - 52.0 %   MCV 102.3 (H) 78.0 - 100.0 fl   MCHC 34.3 30.0 - 36.0 g/dL   RDW 12.4 11.5 - 15.5 %   Platelets 335.0 150.0 - 400.0 K/uL   Neutrophils Relative % 63.9 43.0 - 77.0 %   Lymphocytes Relative 19.4 12.0 - 46.0 %   Monocytes Relative 10.7 3.0 - 12.0 %   Eosinophils Relative 3.0 0.0 - 5.0 %   Basophils Relative 3.0 0.0 - 3.0 %   Neutro Abs 3.2 1.4 - 7.7 K/uL   Lymphs Abs 1.0 0.7 - 4.0 K/uL   Monocytes Absolute 0.5 0.1 - 1.0 K/uL   Eosinophils Absolute 0.1 0.0 - 0.7 K/uL   Basophils Absolute 0.1 0.0 - 0.1 K/uL  Comp Met (CMET)     Status: None   Collection Time: 11/26/21  9:39 AM  Result Value Ref Range   Sodium 137 135 - 145 mEq/L   Potassium 4.2 3.5 - 5.1 mEq/L   Chloride 102 96 - 112 mEq/L   CO2 26 19 - 32 mEq/L   Glucose, Bld 80 70 - 99 mg/dL   BUN 10 6 - 23 mg/dL   Creatinine, Ser 0.97 0.40 - 1.50 mg/dL   Total Bilirubin 0.6 0.2 - 1.2 mg/dL   Alkaline Phosphatase 74 39 - 117 U/L   AST 19 0 - 37 U/L   ALT 15 0 - 53 U/L   Total Protein 7.7 6.0 - 8.3 g/dL   Albumin 4.5 3.5 - 5.2 g/dL   GFR 95.96 >60.00 mL/min    Comment: Calculated using the CKD-EPI Creatinine Equation (2021)   Calcium 10.0 8.4 - 10.5 mg/dL  Lipid panel     Status: Abnormal   Collection Time: 11/26/21  9:39 AM  Result Value Ref Range   Cholesterol 188 0 - 200 mg/dL    Comment: ATP III Classification        Desirable:  < 200 mg/dL  Borderline High:  200 - 239 mg/dL          High:  > = 240 mg/dL   Triglycerides 67.0 0.0 - 149.0 mg/dL    Comment: Normal:  <150 mg/dLBorderline High:  150 - 199 mg/dL   HDL 51.20 >39.00 mg/dL   VLDL 13.4 0.0 - 40.0 mg/dL   LDL Cholesterol 123 (H) 0 - 99 mg/dL   Total CHOL/HDL Ratio 4     Comment:                Men          Women1/2 Average Risk     3.4          3.3Average Risk          5.0          4.42X Average Risk          9.6          7.13X Average Risk          15.0          11.0                       NonHDL 136.84     Comment: NOTE:  Non-HDL goal should be 30 mg/dL higher than patient's LDL goal (i.e. LDL goal of < 70 mg/dL, would have non-HDL goal of < 100 mg/dL)  HIV antibody (with reflex)     Status: None   Collection Time: 11/26/21  9:39 AM  Result Value Ref Range   HIV 1&2 Ab, 4th Generation NON-REACTIVE NON-REACTIVE    Comment: HIV-1 antigen and HIV-1/HIV-2 antibodies were not detected. There is no laboratory evidence of HIV infection. Marland Kitchen PLEASE NOTE: This information has been disclosed to you from records whose confidentiality may be protected by state law.  If your state requires such protection, then the state law prohibits you from making any further disclosure of the information without the specific written consent of the person to whom it pertains, or as otherwise permitted by law. A general authorization for the release of medical or other information is NOT sufficient for this purpose. . For additional information please refer to http://education.questdiagnostics.com/faq/FAQ106 (This link is being provided for informational/ educational purposes only.) . Marland Kitchen The performance of this assay has not been clinically validated in patients less than 41 years old. .   Hepatitis C Antibody     Status: None   Collection Time: 11/26/21  9:39 AM  Result Value Ref Range   Hepatitis C Ab NON-REACTIVE NON-REACTIVE    Comment:  . HCV antibody was non-reactive. There is no laboratory  evidence of HCV infection. . In most cases, no further action is required. However, if recent HCV exposure is suspected, a test for HCV RNA (test code [phone]) is suggested. . For additional information please refer to http://education.questdiagnostics.com/faq/FAQ22v1 (This link is being provided for informational/ educational purposes only.) .

## 2022-01-07 DIAGNOSIS — K6389 Other specified diseases of intestine: Secondary | ICD-10-CM | POA: Diagnosis not present

## 2022-01-07 DIAGNOSIS — K5190 Ulcerative colitis, unspecified, without complications: Secondary | ICD-10-CM | POA: Diagnosis not present

## 2022-01-07 DIAGNOSIS — K5100 Ulcerative (chronic) pancolitis without complications: Secondary | ICD-10-CM | POA: Diagnosis not present

## 2022-01-07 DIAGNOSIS — K529 Noninfective gastroenteritis and colitis, unspecified: Secondary | ICD-10-CM | POA: Diagnosis not present

## 2022-01-15 DIAGNOSIS — K5190 Ulcerative colitis, unspecified, without complications: Secondary | ICD-10-CM | POA: Diagnosis not present

## 2022-01-21 ENCOUNTER — Ambulatory Visit (HOSPITAL_COMMUNITY): Payer: Self-pay | Admitting: Psychiatry

## 2022-01-29 ENCOUNTER — Encounter: Payer: Self-pay | Admitting: Internal Medicine

## 2022-01-29 ENCOUNTER — Ambulatory Visit: Payer: BC Managed Care – PPO | Admitting: Internal Medicine

## 2022-01-29 VITALS — BP 125/81 | HR 62 | Temp 97.6°F | Resp 12 | Ht 69.0 in | Wt 162.8 lb

## 2022-01-29 DIAGNOSIS — F39 Unspecified mood [affective] disorder: Secondary | ICD-10-CM

## 2022-01-29 DIAGNOSIS — F1021 Alcohol dependence, in remission: Secondary | ICD-10-CM | POA: Diagnosis not present

## 2022-01-29 MED ORDER — ESCITALOPRAM OXALATE 10 MG PO TABS: 10.0000 mg | ORAL_TABLET | Freq: Every day | ORAL | 1 refills | Status: DC

## 2022-01-29 NOTE — Assessment & Plan Note (Signed)
GAD 17 was 17 with a very difficult response and I do not think this is alcohol withdrawal related as he has been drinking very little for a while now I think it is just an underlying anxiety disorder that is unmasked by the fact that he is cut back his drinking and probably was what caused the drinking problem in the first place probably genetic and therefore I recommend long-term treatment with antianxiety medication starting with a trial of Lexapro we will see how it works for him and follow-up in 1 month fx expected

## 2022-01-29 NOTE — Patient Instructions (Signed)
It was a pleasure seeing you today! I truly hope you feel like you received 5 star service and please let me know if there is anything I can improve.  Rashada Klontz G Jaila Schellhorn, MD   Today the plan is...  There are no diagnoses linked to this encounter.     [x] No follow-ups on file.   [x] If your condition begins to worsen or become severe:  GO to the ER  [x] If you are not doing well: RETURN to the office sooner  [x] If you have follow-up questions / concerns: please contact me  -via phone [phone] OR MyChart messaging   [x] Please bring all your medicines to each appointment  [x]  You will be contacted with the lab results as soon as they are available. For any labs or imaging tests, we will call you if the results are significantly abnormal.  Most normal results will be posted to myChart as soon as they are available and I will comment on them there within 2-3 business days.  [x] Billing questions for xray and lab orders are billed from separate companies and questions/concerns should be directed to the invoicing company.  For visit charges please discuss with our administrative services.  [x] You will be contacted with the lab results as soon as they are available. The fastest way to get your results is to activate your My Chart account. Instructions are located on the last page of this paperwork. If you have not heard from us regarding the results in 2 weeks, please contact this office. 

## 2022-01-29 NOTE — Progress Notes (Signed)
[place] at [employer]:  [phone]   Routine Medical Office Visit  Patient:  Brett Rubio      Age: 41 y.o.       Sex:  male  Date:   01/29/2022  PCP:    Loralee Pacas, MD    [place] Provider: Loralee Pacas, MD  Assessment/Plan:   Brett Rubio was seen today for 1 month follow-up.  Alcohol dependence in remission [facility]) Overview: History of delirium tremens Did medical detox with librium/trazodone, at [facility]. Not a fan of 12 steps Commitment to recovery is for his family Gabapentin helpful   Orders: -     Escitalopram Oxalate; Take 1 tablet (10 mg total) by mouth daily. Thought at one half a dose for first 2 weeks.  Do not suddenly stop after taking it for a while.  Do not judge until you have been on it for 3 to 4 weeks consistently.  Dispense: 30 tablet; Refill: 1  Mood disorder ([facility]) Assessment & Plan: GAD 17 was 17 with a very difficult response and I do not think this is alcohol withdrawal related as he has been drinking very little for a while now I think it is just an underlying anxiety disorder that is unmasked by the fact that he is cut back his drinking and probably was what caused the drinking problem in the first place probably genetic and therefore I recommend long-term treatment with antianxiety medication starting with a trial of Lexapro we will see how it works for him and follow-up in 1 month fx expected      No follow-ups on file.   Today's key discussion points - also in After Visit Summary (AVS) Common side effects, risks, benefits, and alternatives for medications and treatment plan prescribed today were discussed, and he expressed understanding of the given instructions.  Medication list was reconciled and patient instructions and summary information was documented and made available for him to review in the AVS (see AVS).  This note is also available to patient for review for accuracy and understanding. He was  encouraged to contact our office by phone or message via MyChart if he has any questions or concerns regarding our treatment plan (see AVS).  No barriers to understanding were identified  Subjective:   Brett Rubio is a 41 y.o. male with PMH significant for: Past Medical History:  Diagnosis Date   Alcohol dependence ([place]) 11/26/2021   History of delirium tremens Did medical detox with librium/trazodone, at [facility]. Not a fan of 12 steps    History of ADHD 12/29/2021     He is presenting today with: Chief Complaint  Patient presents with   1 month follow-up    Everything is about the same, no complaints.     Additional physician collected history: See Assessment/Plan section for per problem updates to history (overview and a/p subsections) as reported by patient today. So he reports that his GAD is 7 score is at 17 and he is still having a lot of anxiety even though he reports feeling like the acamprosate and gabapentin are helping a lot with those things.  He is still drinking a little bit but has dramatically cut back but its only through sheer willpower and a desire to take care of his very high stress job He reports he was advised that this inflammation is down and there is just scarring now from his Crohn's and so he is feels like there is progress being  made there just by cutting back alcohol        Objective:  Physical Exam: BP 125/81 (BP Location: Right Arm, Patient Position: Sitting)   Pulse 62   Temp 97.6 F (36.4 C) (Temporal)   Resp 12   Ht _0  (1.753 m)   Wt 162 lb 12.8 oz (73.8 kg)   SpO2 98%   BMI 24.04 kg/m   He is a polite, friendly, and genuine person Constitutional: NAD, AAO, not ill-appearing  Neuro: alert, no focal deficit obvious, articulate speech Psych: normal mood, behavior, thought content   Problem specific physical exam findings:  There is absolutely 0 tremor or anxiety apparent.   Results:  No results found for any visits on  01/29/22.   Recent Results (from the past 2160 hour(s))  CBC w/Diff     Status: Abnormal   Collection Time: 11/26/21  9:39 AM  Result Value Ref Range   WBC 4.9 4.0 - 10.5 K/uL   RBC 4.16 (L) 4.22 - 5.81 Mil/uL   Hemoglobin 14.6 13.0 - 17.0 g/dL   HCT 42.5 39.0 - 52.0 %   MCV 102.3 (H) 78.0 - 100.0 fl   MCHC 34.3 30.0 - 36.0 g/dL   RDW 12.4 11.5 - 15.5 %   Platelets 335.0 150.0 - 400.0 K/uL   Neutrophils Relative % 63.9 43.0 - 77.0 %   Lymphocytes Relative 19.4 12.0 - 46.0 %   Monocytes Relative 10.7 3.0 - 12.0 %   Eosinophils Relative 3.0 0.0 - 5.0 %   Basophils Relative 3.0 0.0 - 3.0 %   Neutro Abs 3.2 1.4 - 7.7 K/uL   Lymphs Abs 1.0 0.7 - 4.0 K/uL   Monocytes Absolute 0.5 0.1 - 1.0 K/uL   Eosinophils Absolute 0.1 0.0 - 0.7 K/uL   Basophils Absolute 0.1 0.0 - 0.1 K/uL  Comp Met (CMET)     Status: None   Collection Time: 11/26/21  9:39 AM  Result Value Ref Range   Sodium 137 135 - 145 mEq/L   Potassium 4.2 3.5 - 5.1 mEq/L   Chloride 102 96 - 112 mEq/L   CO2 26 19 - 32 mEq/L   Glucose, Bld 80 70 - 99 mg/dL   BUN 10 6 - 23 mg/dL   Creatinine, Ser 0.97 0.40 - 1.50 mg/dL   Total Bilirubin 0.6 0.2 - 1.2 mg/dL   Alkaline Phosphatase 74 39 - 117 U/L   AST 19 0 - 37 U/L   ALT 15 0 - 53 U/L   Total Protein 7.7 6.0 - 8.3 g/dL   Albumin 4.5 3.5 - 5.2 g/dL   GFR 95.96 >60.00 mL/min    Comment: Calculated using the CKD-EPI Creatinine Equation (2021)   Calcium 10.0 8.4 - 10.5 mg/dL  Lipid panel     Status: Abnormal   Collection Time: 11/26/21  9:39 AM  Result Value Ref Range   Cholesterol 188 0 - 200 mg/dL    Comment: ATP III Classification       Desirable:  < 200 mg/dL               Borderline High:  200 - 239 mg/dL          High:  > = 240 mg/dL   Triglycerides 67.0 0.0 - 149.0 mg/dL    Comment: Normal:  <150 mg/dLBorderline High:  150 - 199 mg/dL   HDL 51.20 >39.00 mg/dL   VLDL 13.4 0.0 - 40.0 mg/dL   LDL Cholesterol 123 (H) 0 -  99 mg/dL   Total CHOL/HDL Ratio 4     Comment:                 Men          Women1/2 Average Risk     3.4          3.3Average Risk          5.0          4.42X Average Risk          9.6          7.13X Average Risk          15.0          11.0                       NonHDL 136.84     Comment: NOTE:  Non-HDL goal should be 30 mg/dL higher than patient's LDL goal (i.e. LDL goal of < 70 mg/dL, would have non-HDL goal of < 100 mg/dL)  HIV antibody (with reflex)     Status: None   Collection Time: 11/26/21  9:39 AM  Result Value Ref Range   HIV 1&2 Ab, 4th Generation NON-REACTIVE NON-REACTIVE    Comment: HIV-1 antigen and HIV-1/HIV-2 antibodies were not detected. There is no laboratory evidence of HIV infection. Marland Kitchen PLEASE NOTE: This information has been disclosed to you from records whose confidentiality may be protected by state law.  If your state requires such protection, then the state law prohibits you from making any further disclosure of the information without the specific written consent of the person to whom it pertains, or as otherwise permitted by law. A general authorization for the release of medical or other information is NOT sufficient for this purpose. . For additional information please refer to http://education.questdiagnostics.com/faq/FAQ106 (This link is being provided for informational/ educational purposes only.) . Marland Kitchen The performance of this assay has not been clinically validated in patients less than 2 years old. .   Hepatitis C Antibody     Status: None   Collection Time: 11/26/21  9:39 AM  Result Value Ref Range   Hepatitis C Ab NON-REACTIVE NON-REACTIVE    Comment: . HCV antibody was non-reactive. There is no laboratory  evidence of HCV infection. . In most cases, no further action is required. However, if recent HCV exposure is suspected, a test for HCV RNA (test code [phone]) is suggested. . For additional information please refer to http://education.questdiagnostics.com/faq/FAQ22v1 (This link is  being provided for informational/ educational purposes only.) .   CBC w/Diff     Status: Abnormal   Collection Time: 12/29/21  9:14 AM  Result Value Ref Range   WBC 7.4 4.0 - 10.5 K/uL   RBC 4.17 (L) 4.22 - 5.81 Mil/uL   Hemoglobin 14.4 13.0 - 17.0 g/dL   HCT 41.9 39.0 - 52.0 %   MCV 100.5 (H) 78.0 - 100.0 fl   MCHC 34.3 30.0 - 36.0 g/dL   RDW 12.5 11.5 - 15.5 %   Platelets 314.0 150.0 - 400.0 K/uL   Neutrophils Relative % 72.2 43.0 - 77.0 %   Lymphocytes Relative 16.7 12.0 - 46.0 %   Monocytes Relative 8.1 3.0 - 12.0 %   Eosinophils Relative 1.8 0.0 - 5.0 %   Basophils Relative 1.2 0.0 - 3.0 %   Neutro Abs 5.4 1.4 - 7.7 K/uL   Lymphs Abs 1.2 0.7 - 4.0 K/uL   Monocytes Absolute 0.6 0.1 -  1.0 K/uL   Eosinophils Absolute 0.1 0.0 - 0.7 K/uL   Basophils Absolute 0.1 0.0 - 0.1 K/uL  PSA     Status: None   Collection Time: 12/29/21  9:14 AM  Result Value Ref Range   PSA 2.37 0.10 - 4.00 ng/mL    Comment: Test performed using [employer] PSA Assay, a parmagnetic partical, chemiluminecent immunoassay.

## 2022-01-29 NOTE — Assessment & Plan Note (Signed)
>>  ASSESSMENT AND PLAN FOR MOOD DISORDER WRITTEN ON 01/29/2022  9:45 AM BY Lula Olszewski, MD  GAD 17 was 17 with a very difficult response and I do not think this is alcohol withdrawal related as he has been drinking very little for a while now I think it is just an underlying anxiety disorder that is unmasked by the fact that he is cut back his drinking and probably was what caused the drinking problem in the first place probably genetic and therefore I recommend long-term treatment with antianxiety medication starting with a trial of Lexapro we will see how it works for him and follow-up in 1 month fx expected

## 2022-01-29 NOTE — Assessment & Plan Note (Signed)
Now in remission Encouraged continue meds at same dose and we will start treating underlying anxiety disorder.

## 2022-02-09 ENCOUNTER — Encounter (HOSPITAL_COMMUNITY): Payer: Self-pay | Admitting: Psychiatry

## 2022-02-09 ENCOUNTER — Ambulatory Visit (HOSPITAL_COMMUNITY): Payer: BC Managed Care – PPO | Admitting: Psychiatry

## 2022-02-09 DIAGNOSIS — F331 Major depressive disorder, recurrent, moderate: Secondary | ICD-10-CM

## 2022-02-09 DIAGNOSIS — F411 Generalized anxiety disorder: Secondary | ICD-10-CM

## 2022-02-09 DIAGNOSIS — F329 Major depressive disorder, single episode, unspecified: Secondary | ICD-10-CM | POA: Insufficient documentation

## 2022-02-09 DIAGNOSIS — F1020 Alcohol dependence, uncomplicated: Secondary | ICD-10-CM | POA: Diagnosis not present

## 2022-02-09 NOTE — Progress Notes (Signed)
Psychiatric Initial Adult Assessment   Patient Identification: Brett Rubio MRN:  [id number] Date of Evaluation:  02/09/2022 Referral Source: PCP Chief Complaint:   Chief Complaint  Patient presents with   Alcohol Problem   Anxiety   Depression   Visit Diagnosis:    ICD-10-CM   1. Moderate alcohol use disorder (HCC)  F10.20     2. Moderate episode of recurrent major depressive disorder (HCC)  F33.1     3. GAD (generalized anxiety disorder)  F41.1        Assessment:  LAWERANCE Rubio is a 41 y.o. y.o. male with a history of mood disorder, alcohol use disorder currently currently active, and reported ADHD who presents in person to [place] at [facility] for initial evaluation on 02/09/2022.    Patient reports neurovegetative symptoms of depression including anhedonia, amotivation, low mood, fatigue, decreased appetite, negative self thoughts, and difficulty concentrating.  He also endorses symptoms of anxiety including feeling nervous or on edge, being unable to stop controls worrying, worrying too much about different things, coming easily annoyed or irritable, and always being afraid that something awful might happen.  Of note patient has significant history of moderate to severe alcohol use disorder from which he has experienced seizures, DTs, and medical side effects in the past.  Currently his alcohol use has decreased while he is trying to work on his sobriety.  He does note some decreased libido and ability to get erections however this appears to be psychogenic in nature due to his ability to get erections overnight.  Patient would benefit from starting on an antidepressant such as Lexapro which has been prescribed by his PCP, risk and benefits of this were discussed.  He was also encouraged to consider therapy to help discuss some of his underlying mood symptoms and sexual dysfunction.  Patient likely does have an underlying mood disorder such as MDD and  anxiety disorder/GAD that he would benefit from treatment, however benefits of said treatment may be difficult to see until the alcohol use disorder is under control.  He would benefit from connecting with an alcohol support group and therapy/substance use counselor.  Patient expressed some interest in a therapist at this time but did not have interest in other options for his substance use.    A number of assessments were performed during the evaluation today including nutritional assessment which was 2, pain assessment which showed a 3 out of 10 secondary to back pain, PHQ-9 which they scored a 17 on, GAD-7 which they scored a 18 on, and [place] suicide severity screening which showed no risk.    Plan: - Continue Lexapro 10 mg QD - Continue Campral 666 mg TID prn uses at night - Continue Trazodone 50 mg QHS - Continue gabapentin 300 mg TID managed by PCP - CBC, CMP, lipid panel, and T4 reviewed - Recommended patient connect with therapy, options were provided - Recommended patient consider into AA alternatives - Crisis resources reviewed -Reach out for follow-up in the future if needed  History of Present Illness: Patient presents reporting that he is here for a second opinion about his mental health and alcohol use.  He notes he has been working with his PCP to help manage this with some improvement in his symptoms but has started to get worried about the various medications that have been started and the potential side effects.  Patient reports that he feels that he has been depressed since he was born.  He describes  feeling this way for as long as he can remember including symptoms of anhedonia, amotivation, difficulty sleeping, fatigue, poor appetite, difficulty concentrating, and negative thoughts.  Brett Rubio is unsure why he feels this way as he believes he had a very pretty good upbringing from a nice family and now has a great job with a loving wife and family.  However despite all that he  still feels depressed and not get enjoyment out of most things.  He denies that reaching the point of suicidality or thoughts of self-harm.  Patient also endorsed symptoms of anxiety including feeling nervous or on edge, not being able to stop or control his worrying and worrying about too many different things, difficulty relaxing, restlessness, irritability, and feeling afraid if something awful might happen.  He notes that the symptoms are present from the moment he wakes up in the morning with minimal improvement throughout the day.  Now patient has been using alcohol as a coping mechanism/numbing technique to help diminish the anxiety with limited benefit.  He has been working towards cutting down his drinking with the goal of quitting for an extended period of time now and notes that his substance use has diminished significantly.  He reports drinking on average couple drinks a day.  She notes that he first started using substances around the age [age] at which point he was trying multiple different things.  At age 88 he started to use alcohol and it quickly progressed to the point of heavy use.  Once the alcohol use came to the picture of the other drug use discontinued.  At first patient reported that the alcohol gave a degree of peacefulness to his life, however he started to develop physical dependence over time.  This led to the physiological issues with his GI system and he was eventually informed that his symptoms were secondary to alcohol use.  Following back patient has been working to cut down and discontinue his alcohol use.  He has attended detox before and notes that his longest period of sobriety was about 8 to 9 months which was around 8 years ago.  More recently in the last couple years his longest period of sobriety is 1 month.  Patient reports that he does better when he feels there is a sense of accountability, for instance when his wife was home where he works from home it was easier to  control his drinking.  Patient has attempted AA in the past however did not like to be religious nature of the program despite finding the book helpful.  Other programs have been suggested to him in the past however he has not looked into them.  Patient expressed some interest in therapy and was open to discussing medication options.  In regards to medications patient tried antidepressants around 15 to 20 years ago with Wellbutrin and 1 other he could not remember.  He recalls that the Wellbutrin caused tremors and made him feel muted which she did not like.  Since then he has been feeling more hesitant about trying these types of medications.  His PCP has started Lexapro earlier this month which she has yet to try.  One of his primary concerns is sexual side effects from the medications.  He currently reports that he has no libido and difficulty getting an erection, however he can get an erection overnight when he is sleeping.  We discussed how stress and mood symptoms can affect the symptoms and how antidepressants could in fact improve his sexual  function.  Patient is also on a regimen of gabapentin, Campral, and trazodone.  He notes benefit from the gabapentin, but is concerned that it is just replacing the alcohol.  The Campral he has some benefit from however taking it 3 times a day he is to difficult and medication to make him feel a bit dissociated.  Patient did not report issues with the trazodone.  Again we discussed the various medications and the benefits of gabapentin over the alcohol.  Patient was encouraged to give the Lexapro try as he is currently not on any long-term medications to treat his underlying mood symptoms.  He reports being open to doing this.  Alijah also did express interest in therapy and we discussed a few different options.  He was provided with information to reach out to a therapist if he is inclined.  Also discussed substance use treatment options including AA and similar  alternatives as well as substance use IOP program.  Patient expressed a disinterest in these initially due to the religious nature of AA however later stated that he has never had a good connection with any of the people and feels like they are more self centered.  Potential for follow-up was discussed and patient was open to reaching out again in the future if needed.  Associated Signs/Symptoms: Depression Symptoms:  depressed mood, anhedonia, insomnia, fatigue, feelings of worthlessness/guilt, difficulty concentrating, impaired memory, anxiety, loss of energy/fatigue, disturbed sleep, weight loss, decreased labido, decreased appetite, (Hypo) Manic Symptoms:  Irritable Mood, Anxiety Symptoms:  Excessive Worry, Psychotic Symptoms:   denies PTSD Symptoms: NA  Past Psychiatric History: Patient denies any significant psychiatric history.  He denies ever being connected with a psychiatrist or so cannot remember.  As for therapy he reports minimal experiences in the past.  He denies any prior suicide attempts or psychiatric hospitalizations.  [facility].  Bad reaction to wellbutrin and has concerns about sexual side effects.  He believes he may have tried 1 other antidepressant but cannot recall.  Patient's current regimen is Lexapro 10 mg which she has yet to start, gabapentin 300 mg 3 times a day, Campral 666 mg 3 times a day which she is currently only taking at night, and trazodone 50 mg at bedtime.  Patient started using substances around the age [age] with a variety of different substances.  At 19 he began to use alcohol which replaced all the other substances and quickly became more excessive than use.  Patient has started to work on maintaining his sobriety in the last 10 years and reports his longest period was 8 months.  Currently he reports drinking an average of 2 ounces of alcohol a day.  Previous Psychotropic Medications: Yes    Substance Abuse History in the last 12 months:  Yes.    Consequences of Substance Abuse: Medical Consequences:  seizures, fever Blackouts:  yes DT's: in the past Withdrawal Symptoms:   Diaphoresis Headaches Nausea Tremors Vomiting  Past Medical History:  Past Medical History:  Diagnosis Date   Alcohol dependence ([place]) 11/26/2021   History of delirium tremens Did medical [facility], [facility]. Not a fan of 12 steps    History of ADHD 12/29/2021   History reviewed. No pertinent surgical history.  Family Psychiatric History: denied  Family History:  Family History  Problem Relation Age of Onset   Cancer Father    Alcohol abuse Brother     Social History:  Social History   Socioeconomic History   Marital status: Married    Spouse name: Not on file   Number of children: Not on file   Years of education: Not on file   Highest education level: Not on file  Occupational History   Not on file  Tobacco Use   Smoking status: Former    Types: Cigarettes   Smokeless tobacco: Never  Vaping Use   Vaping Use: Never used  Substance and Sexual Activity   Alcohol use: Not Currently    Comment: Formerly-15+ glasses of wine, 30+ cans of beer   Drug use: Not Currently   Sexual activity: Yes  Other Topics Concern   Not on file  Social History Narrative   Not on file   Social Determinants of Health   Financial Resource Strain: Not on file  Food Insecurity: Not on file  Transportation Needs: Not on file  Physical Activity: Not on file  Stress: Not on file  Social Connections: Not on file    Additional Social History: Patient reports a good upbringing with his family.  He went to [employer] school which he disliked.  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  Patient worked at [employer] for over a decade.  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  Patient reports severe stress  at work.  Patient lives with his wife and 2 daughters who are 44 and 14.    Allergies:   Allergies  Allergen Reactions   Sulfa Antibiotics Nausea And Vomiting    REACTION: nausea   Erythromycin     REACTION: nausea   Sulfonamide Derivatives     REACTION: nausea    Metabolic Disorder Labs: No results found for: "HGBA1C", "MPG" No results found for: "PROLACTIN" Lab Results  Component Value Date   CHOL 188 11/26/2021   TRIG 67.0 11/26/2021   HDL 51.20 11/26/2021   CHOLHDL 4 11/26/2021   VLDL 13.4 11/26/2021   LDLCALC 123 (H) 11/26/2021   No results found for: "TSH"  Therapeutic Level Labs: No results found for: "LITHIUM" No results found for: "CBMZ" No results found for: "VALPROATE"  Current Medications: Current Outpatient Medications  Medication Sig Dispense Refill   acamprosate (CAMPRAL) 333 MG tablet Take 2 tablets (666 mg total) by mouth 3 (three) times daily with meals. As needed only, to relieve stress and reduce alcohol craving 180 tablet 3   escitalopram (LEXAPRO) 10 MG tablet Take 1 tablet (10 mg total) by mouth daily. Thought at one half a dose for first 2 weeks.  Do not suddenly stop after taking it for a while.  Do not judge until you have been on it for 3 to 4 weeks consistently. 30 tablet 1   gabapentin (NEURONTIN) 300 MG capsule Take 1 capsule (300 mg total) by mouth 3 (three) times daily. 90 capsule 3   Mesalamine 800 MG TBEC Take by mouth.     Multiple Vitamins-Minerals (MULTIVITAMIN ADULTS PO) Take 1 tablet by mouth daily.     Na Sulfate-K Sulfate-Mg Sulf (SUPREP BOWEL PREP KIT) 17.5-3.13-1.6 GM/177ML SOLN See admin instructions. (Patient not taking: Reported on 01/29/2022)     Na Sulfate-K Sulfate-Mg Sulf 17.5-3.13-1.6 GM/177ML SOLN Take by mouth. (Patient not taking: Reported on 01/29/2022)     traZODone (DESYREL) 50 MG tablet Take 1 tablet (50 mg total) by mouth at bedtime as needed. 90 tablet 3   valACYclovir (VALTREX)  500 MG tablet Take 500 mg by mouth  every morning.     No current facility-administered medications for this visit.    Musculoskeletal: Strength & Muscle Tone: within normal limits Gait & Station: normal Patient leans: N/A  Psychiatric Specialty Exam: Review of Systems  There were no vitals taken for this visit.There is no height or weight on file to calculate BMI.  General Appearance: Fairly Groomed  Eye Contact:  Good  Speech:  Clear and Coherent and Normal Rate  Volume:  Normal  Mood:  Anxious, Depressed, Hopeless, Irritable, and defensive  Affect:  Restricted  Thought Process:  Coherent  Orientation:  Full (Time, Place, and Person)  Thought Content:  Logical, Illogical, and Computation  Suicidal Thoughts:  No  Homicidal Thoughts:  No  Memory:  NA  Judgement:  Impaired  Insight:  Lacking  Psychomotor Activity:  Normal  Concentration:  Concentration: Good  Recall:  [place] of Knowledge:Fair  Language: Good  Akathisia:  NA    AIMS (if indicated):  not done  Assets:  Agricultural consultant Housing Intimacy Social Support Talents/Skills Transportation Vocational/Educational  ADL's:  Intact  Cognition: WNL  Sleep:  Fair   Screenings: GAD-7    [place] Office Visit from 02/09/2022 in [facility] Office Visit from 01/29/2022 in [place]  Total GAD-7 Score 18 17      PHQ2-9    [place] Visit from 02/09/2022 in [facility] Office Visit from 11/26/2021 in [place]  PHQ-2 Total Score 5 0  PHQ-9 Total Score 17 --      [place] Visit from 02/09/2022 in [place] No Risk        Collaboration of Care: Primary Care Provider AEB chart review  Patient/Guardian was advised Release of Information must be obtained prior to any record release in  order to collaborate their care with an outside provider. Patient/Guardian was advised if they have not already done so to contact the registration department to sign all necessary forms in order for [place] to release information regarding their care.   Consent: Patient/Guardian gives verbal consent for treatment and assignment of benefits for services provided during this visit. Patient/Guardian expressed understanding and agreed to proceed.   Vista Mink, MD 11/13/20236:45 PM

## 2022-02-27 ENCOUNTER — Ambulatory Visit: Payer: BC Managed Care – PPO | Admitting: Internal Medicine

## 2022-03-03 ENCOUNTER — Ambulatory Visit: Payer: BC Managed Care – PPO | Admitting: Internal Medicine

## 2022-03-03 ENCOUNTER — Encounter: Payer: Self-pay | Admitting: Internal Medicine

## 2022-03-03 VITALS — BP 114/71 | HR 68 | Temp 97.9°F | Resp 12 | Ht 69.0 in | Wt 162.2 lb

## 2022-03-03 DIAGNOSIS — F411 Generalized anxiety disorder: Secondary | ICD-10-CM | POA: Diagnosis not present

## 2022-03-03 DIAGNOSIS — F1020 Alcohol dependence, uncomplicated: Secondary | ICD-10-CM

## 2022-03-03 DIAGNOSIS — F10282 Alcohol dependence with alcohol-induced sleep disorder: Secondary | ICD-10-CM

## 2022-03-03 MED ORDER — ACAMPROSATE CALCIUM 333 MG PO TBEC: 666.0000 mg | DELAYED_RELEASE_TABLET | Freq: Three times a day (TID) | ORAL | 1 refills | Status: DC

## 2022-03-03 MED ORDER — GABAPENTIN 300 MG PO CAPS: 300.0000 mg | ORAL_CAPSULE | Freq: Three times a day (TID) | ORAL | 1 refills | Status: DC

## 2022-03-03 MED ORDER — ESCITALOPRAM OXALATE 20 MG PO TABS: 20.0000 mg | ORAL_TABLET | Freq: Every day | ORAL | 3 refills | Status: DC

## 2022-03-03 NOTE — Progress Notes (Signed)
[facility]: [phone]   Routine Medical Office Visit  Patient:  Brett Rubio      Age: 41 y.o.       Sex:  male  Date:   03/03/2022  PCP:    Loralee Pacas, MD   [facility] Provider: Loralee Pacas, MD  Assessment/Plan:   Cadan was seen today for 1 month follow-up.  Moderate alcohol use disorder (HCC) Overview: History of delirium tremens Did medical [facility]. Not a fan of 12 steps Commitment to recovery is for his family Gabapentin/acamprosate helpful Traveling a lot for job ([employer]) can be tempting and burnout for family.   Assessment & Plan: Encouraged him to look at other careers- if possible redg travel could help recovery by improving closeness to family, and reduce travel triggers. Has led to airport drink, but is still making a strong effort to stay sober He plans to start doing smart recovery program, which I encouraged Confirmed he is taking and encouraged to continue Outpatient Medications Prior to Visit  Medication Sig Dispense Refill   acamprosate (CAMPRAL) 333 MG tablet Take 2 tablets (666 mg total) by mouth 3 (three) times daily with meals. As needed only, to relieve stress and reduce alcohol craving 180 tablet 3   escitalopram (LEXAPRO) 10 MG tablet Take 1 tablet (10 mg total) by mouth daily. Thought at one half a dose for first 2 weeks.  Do not suddenly stop after taking it for a while.  Do not judge until you have been on it for 3 to 4 weeks consistently. 30 tablet 1   gabapentin (NEURONTIN) 300 MG capsule Take 1 capsule (300 mg total) by mouth 3 (three) times daily. 90 capsule 3   Mesalamine 800 MG TBEC Take by mouth.     Multiple Vitamins-Minerals (MULTIVITAMIN ADULTS PO) Take 1 tablet by mouth daily.     traZODone (DESYREL) 50 MG tablet Take 1 tablet (50 mg total) by mouth at bedtime as needed. 90 tablet 3   valACYclovir (VALTREX) 500 MG tablet Take 500 mg by mouth every  morning.     Na Sulfate-K Sulfate-Mg Sulf (SUPREP BOWEL PREP KIT) 17.5-3.13-1.6 GM/177ML SOLN See admin instructions. (Patient not taking: Reported on 03/03/2022)     Na Sulfate-K Sulfate-Mg Sulf 17.5-3.13-1.6 GM/177ML SOLN Take by mouth. (Patient not taking: Reported on 01/29/2022)     No facility-administered medications prior to visit.     Orders: -     Escitalopram Oxalate; Take 1 tablet (20 mg total) by mouth daily.  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 As needed only, to relieve stress and reduce alcohol craving  Dispense: 540 tablet; Refill: 1 -     Gabapentin; Take 1 capsule (300 mg total) by mouth 3 (three) times daily.  Dispense: 270 capsule; Refill: 1  GAD (generalized anxiety disorder) Overview:    03/03/2022   11:56 AM 02/09/2022    5:53 PM 01/29/2022    9:35 AM  GAD 7 : Generalized Anxiety Score  Nervous, Anxious, on Edge 2  3  Control/stop worrying 2  2  Worry too much - different things 2  2  Trouble relaxing 1  2  Restless 2  2  Easily annoyed or irritable 1  3  Afraid - awful might happen 3  3  Total GAD 7 Score 13  17  Anxiety Difficulty Very difficult  Very difficult     Information is confidential and restricted. Go to Review Flowsheets to unlock data.          No follow-ups on file.   Today's key discussion points and After Visit Summary (AVS) reminders.  He was encouraged to contact our office by phone or message via MyChart if he has any questions or concerns regarding our treatment plan (see AVS). He was given an opportunity to ask questions/clarifications about any aspect of the diagnosis and treatment plan at today's visit. Common side effects, risks, benefits, and alternatives for medications and treatment plan prescribed today were discussed. He expressed understanding of the given instructions.   We discussed red flag symptoms and signs in detail and when to call the office or go to ER if his condition worsens (see AVS). He expressed understanding and AVS is used to reinforce. Medication list was reconciled and patient instructions and summary information was documented and made available for him to review in the AVS (see AVS).  This entire medical encounter document is also available on MyChart for him to review for accuracy and understanding.  Review of this document is encouraged in the AVS. No barriers to understanding were identified     Subjective:   Brett Rubio is a 41 y.o. male with past medical history including: Past Medical History:  Diagnosis Date   Alcohol dependence ([place]) 11/26/2021   History of delirium tremens Did medical [facility]. Not a fan of 12 steps    History of ADHD 12/29/2021     Listed as currently taking:  Current Outpatient Medications:    Mesalamine 800 MG TBEC, Take by mouth., Disp: , Rfl:    Multiple Vitamins-Minerals (MULTIVITAMIN ADULTS PO), Take 1 tablet by mouth daily., Disp: , Rfl:    traZODone (DESYREL) 50 MG tablet, Take 1 tablet (50 mg total) by mouth at bedtime as needed., Disp: 90 tablet, Rfl: 3   valACYclovir (VALTREX) 500 MG tablet, Take 500 mg by mouth every morning., Disp: , Rfl:    acamprosate (CAMPRAL) 333 MG tablet, Take 2 tablets (666 mg total) by mouth 3 (three) times daily with meals. As needed only, to relieve stress and reduce alcohol craving, Disp: 540 tablet, Rfl: 1   escitalopram (LEXAPRO) 20 MG tablet, Take 1 tablet (20 mg total) by mouth daily., Disp: 90 tablet, Rfl: 3   gabapentin (NEURONTIN) 300 MG capsule, Take 1 capsule (300 mg total) by mouth 3 (three) times daily., Disp: 270 capsule, Rfl: 1    He presented today reporting reason for visit as: Chief Complaint  Patient presents with   1 month follow-up    Everything is about the same since the last visit.     Problem focused  charting was used to record today's medical interview as follows: Problem  Gad (Generalized Anxiety Disorder)      03/03/2022   11:56 AM 02/09/2022    5:53 PM 01/29/2022    9:35 AM  GAD 7 : Generalized Anxiety Score  Nervous, Anxious, on Edge 2  3  Control/stop worrying 2  2  Worry too much - different things 2  2  Trouble relaxing 1  2  Restless 2  2  Easily annoyed or irritable 1  3  Afraid - awful might happen 3  3  Total GAD 7 Score 13  17  Anxiety Difficulty Very difficult  Very difficult  Information is confidential and restricted. Go to Review Flowsheets to unlock data.       Moderate Alcohol Use Disorder (Hcc)   History of delirium tremens Did medical detox [facility]. Not a fan of 12 steps Commitment to recovery is for his family Gabapentin/acamprosate helpful Traveling a lot for job ([employer]) can be tempting and burnout for family.              Objective:  Physical Exam: BP 114/71 (BP Location: Right Arm, Patient Position: Sitting)   Pulse 68   Temp 97.9 F (36.6 C) (Temporal)   Resp 12   Ht _0  (1.753 m)   Wt 162 lb 3.2 oz (73.6 kg)   SpO2 96%   BMI 23.95 kg/m  Problem-specific physical exam findings:  Not anxious appearing, very polite, well dressed  Results Reviewed:  No results found for any visits on 03/03/22.   Recent Results (from the past 2160 hour(s))  CBC w/Diff     Status: Abnormal   Collection Time: 12/29/21  9:14 AM  Result Value Ref Range   WBC 7.4 4.0 - 10.5 K/uL   RBC 4.17 (L) 4.22 - 5.81 Mil/uL   Hemoglobin 14.4 13.0 - 17.0 g/dL   HCT 41.9 39.0 - 52.0 %   MCV 100.5 (H) 78.0 - 100.0 fl   MCHC 34.3 30.0 - 36.0 g/dL   RDW 12.5 11.5 - 15.5 %   Platelets 314.0 150.0 - 400.0 K/uL   Neutrophils Relative % 72.2 43.0 - 77.0 %   Lymphocytes Relative 16.7 12.0 - 46.0 %   Monocytes Relative 8.1 3.0 - 12.0 %   Eosinophils Relative 1.8 0.0 - 5.0 %   Basophils Relative 1.2 0.0 - 3.0 %    Neutro Abs 5.4 1.4 - 7.7 K/uL   Lymphs Abs 1.2 0.7 - 4.0 K/uL   Monocytes Absolute 0.6 0.1 - 1.0 K/uL   Eosinophils Absolute 0.1 0.0 - 0.7 K/uL   Basophils Absolute 0.1 0.0 - 0.1 K/uL  PSA     Status: None   Collection Time: 12/29/21  9:14 AM  Result Value Ref Range   PSA 2.37 0.10 - 4.00 ng/mL    Comment: Test performed using Access [employer] PSA Assay, a parmagnetic partical, chemiluminecent immunoassay.      Loralee Pacas, MD

## 2022-03-03 NOTE — Patient Instructions (Signed)
It was a pleasure seeing you today!  Your health and satisfaction are my top priorities. If you believe your experience today was worthy of a 5-star rating, I'd be grateful for your feedback! Loralee Pacas, MD   CHECKOUT CHECKLIST  _0    Schedule next appointment(s):    Please schedule a close follow up appointment 1-3 months from now at checkout.  Any requested lab visits should be scheduled as appointments too  If you are not doing well:  Return to the office sooner Please bring all your medicine bottles to each appointment If your condition begins to worsen or become severe:  go to the emergency room or even call 911  _1    Sign release of information authorizations: Any records we need for your care and to be your medical home  REMINDERS:  _2    Please see the Lexapro to 20 mg in the first week just alternate it with the 10 mg to make it easy to go up on the dose with minimal side effects.  If you have trouble with this let me know and come back sooner or I might even just call in something different without discussion.  I am hoping to follow-up with you in 1 to 3 months to further modify the medication.  I also think you need to be seeing behavioral health for the therapy for the anxiety disorder to develop a long-term plan that will NOT require medication.  I encourage you to schedule follow-up with the behavioral health provider that you have established with and try to get something regularly set up to improve anxiety management  _3   (Optional):  Review your clinical notes on MyChart after they are completed.     Today's draft of the physician documented plan for today's visit: (final revisions will be visible on MyChart chart later) Moderate alcohol use disorder ([place]) Assessment & Plan: Encouraged him to look at other careers- if possible redg travel could help recovery by improving closeness to family, and reduce travel triggers. Has led to airport drink, but is still making a  strong effort to stay sober He plans to start doing smart recovery program, which I encouraged Confirmed he is taking and encouraged to continue Outpatient Medications Prior to Visit  Medication Sig Dispense Refill   acamprosate (CAMPRAL) 333 MG tablet Take 2 tablets (666 mg total) by mouth 3 (three) times daily with meals. As needed only, to relieve stress and reduce alcohol craving 180 tablet 3   escitalopram (LEXAPRO) 10 MG tablet Take 1 tablet (10 mg total) by mouth daily. Thought at one half a dose for first 2 weeks.  Do not suddenly stop after taking it for a while.  Do not judge until you have been on it for 3 to 4 weeks consistently. 30 tablet 1   gabapentin (NEURONTIN) 300 MG capsule Take 1 capsule (300 mg total) by mouth 3 (three) times daily. 90 capsule 3   Mesalamine 800 MG TBEC Take by mouth.     Multiple Vitamins-Minerals (MULTIVITAMIN ADULTS PO) Take 1 tablet by mouth daily.     traZODone (DESYREL) 50 MG tablet Take 1 tablet (50 mg total) by mouth at bedtime as needed. 90 tablet 3   valACYclovir (VALTREX) 500 MG tablet Take 500 mg by mouth every morning.     Na Sulfate-K Sulfate-Mg Sulf (SUPREP BOWEL PREP KIT) 17.5-3.13-1.6 GM/177ML SOLN See admin instructions. (Patient not taking: Reported on 03/03/2022)     Na Sulfate-K Sulfate-Mg Sulf 17.5-3.13-1.6 GM/177ML SOLN  Take by mouth. (Patient not taking: Reported on 01/29/2022)     No facility-administered medications prior to visit.      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  Most normal results will be posted to MyChart immediately and have a clinical review message by Dr. Randol Kern posted within 2-3 business days.   If you have not heard from [place] regarding the results in  2 weeks OR if you need priority reporting, please contact this office. MYCHART:  The fastest way to get your results and easiest way to stay in touch with [place] is by activating your My Chart account. Instructions are located on the last page of this paperwork.  BILLING: xray and lab orders are billed from separate companies and questions./concerns should be directed to the [facility].  For visit charges please discuss with our administrative services COMPLAINTS:  please let Dr. Randol Kern know or see the [place], by asking at the front desk: we want you to be satisfied with every experience and we would be grateful for the opportunity to address any problems

## 2022-03-03 NOTE — Assessment & Plan Note (Signed)
Encouraged him to look at other careers- if possible redg travel could help recovery by improving closeness to family, and reduce travel triggers. Has led to airport drink, but is still making a strong effort to stay sober He plans to start doing smart recovery program, which I encouraged Confirmed he is taking and encouraged to continue Outpatient Medications Prior to Visit  Medication Sig Dispense Refill   acamprosate (CAMPRAL) 333 MG tablet Take 2 tablets (666 mg total) by mouth 3 (three) times daily with meals. As needed only, to relieve stress and reduce alcohol craving 180 tablet 3   escitalopram (LEXAPRO) 10 MG tablet Take 1 tablet (10 mg total) by mouth daily. Thought at one half a dose for first 2 weeks.  Do not suddenly stop after taking it for a while.  Do not judge until you have been on it for 3 to 4 weeks consistently. 30 tablet 1   gabapentin (NEURONTIN) 300 MG capsule Take 1 capsule (300 mg total) by mouth 3 (three) times daily. 90 capsule 3   Mesalamine 800 MG TBEC Take by mouth.     Multiple Vitamins-Minerals (MULTIVITAMIN ADULTS PO) Take 1 tablet by mouth daily.     traZODone (DESYREL) 50 MG tablet Take 1 tablet (50 mg total) by mouth at bedtime as needed. 90 tablet 3   valACYclovir (VALTREX) 500 MG tablet Take 500 mg by mouth every morning.     Na Sulfate-K Sulfate-Mg Sulf (SUPREP BOWEL PREP KIT) 17.5-3.13-1.6 GM/177ML SOLN See admin instructions. (Patient not taking: Reported on 03/03/2022)     Na Sulfate-K Sulfate-Mg Sulf 17.5-3.13-1.6 GM/177ML SOLN Take by mouth. (Patient not taking: Reported on 01/29/2022)     No facility-administered medications prior to visit.

## 2022-03-03 NOTE — Assessment & Plan Note (Signed)
I believe anxiety is being and is good enough now that it has reduced his alcohol use relapse risk pretty substantially but it still still making life very difficult and is still a score of 13 on the GAD-7 and so I think it is worth the risks and side effects of increasing the dose of Lexapro up to 20 mg if he can tolerate and we decided to give it a shot after shared decision making and discussion of the risks and benefits

## 2022-03-14 DIAGNOSIS — R051 Acute cough: Secondary | ICD-10-CM | POA: Diagnosis not present

## 2022-03-14 DIAGNOSIS — J0140 Acute pansinusitis, unspecified: Secondary | ICD-10-CM | POA: Diagnosis not present

## 2022-03-14 DIAGNOSIS — R0981 Nasal congestion: Secondary | ICD-10-CM | POA: Diagnosis not present

## 2022-05-04 ENCOUNTER — Ambulatory Visit (INDEPENDENT_AMBULATORY_CARE_PROVIDER_SITE_OTHER): Payer: BC Managed Care – PPO | Admitting: Internal Medicine

## 2022-05-04 ENCOUNTER — Encounter: Payer: Self-pay | Admitting: Internal Medicine

## 2022-05-04 VITALS — BP 107/67 | HR 63 | Temp 97.9°F | Ht 69.0 in | Wt 160.0 lb

## 2022-05-04 DIAGNOSIS — Z8659 Personal history of other mental and behavioral disorders: Secondary | ICD-10-CM

## 2022-05-04 DIAGNOSIS — F1020 Alcohol dependence, uncomplicated: Secondary | ICD-10-CM | POA: Diagnosis not present

## 2022-05-04 DIAGNOSIS — F331 Major depressive disorder, recurrent, moderate: Secondary | ICD-10-CM | POA: Diagnosis not present

## 2022-05-04 MED ORDER — ARIPIPRAZOLE 2 MG PO TABS: 2.0000 mg | ORAL_TABLET | Freq: Every day | ORAL | 2 refills | Status: DC

## 2022-05-04 NOTE — Patient Instructions (Signed)
I'm glad to hear that you're sleeping okay, but it's important to take your doctor's advice seriously. Cognitive Behavioral Therapy (CBT) can be very effective in improving sleep quality, especially when combined with healthy sleep habits. Here are some general tips that might help:  Maintain a regular sleep schedule: Try to go to bed and wake up at the same time every day, even on weekends. This can help regulate your body's internal clock and improve the quality of your sleep.  Create a restful environment: Make sure your bedroom is dark, quiet, and cool. Consider using earplugs, an eye mask, or a white noise machine if needed.  Limit exposure to screens before bed: The light emitted by phones, tablets, computers, and TVs can interfere with your body's production of melatonin, a hormone that regulates sleep.  Exercise regularly: Regular physical activity can help you fall asleep faster and enjoy deeper sleep.  Manage worries: Try to resolve your worries or concerns before bedtime. Stress management techniques such as meditation, deep breathing, and progressive muscle relaxation may be helpful.  Limit alcohol: Alcohol can interfere with your sleep cycle and can cause or worsen sleep problems.  As for CBT with AI, there are several apps and online programs available that can guide you through the process. These tools can help you identify and change thoughts and behaviors that affect your sleep. However, it's important to discuss this with your healthcare provider to ensure it's the right approach for you.  Remember, improving sleep quality is a gradual process and it's okay to seek help. Don't hesitate to reach out to healthcare professionals if you need further assistance. Good luck on your journey to better sleep! ??

## 2022-05-04 NOTE — Assessment & Plan Note (Addendum)
requested medication record(s) from Dr. Harrington Challenger confirming diagnosis .  Offered to resume ritalin / Adderall controlled substance(s) management once we have confirmation of diagnosis  Sent to psychiatry, seems this wasn't discussed at that appointment. Don't have record(s) of what transpired

## 2022-05-04 NOTE — Progress Notes (Signed)
[facility]: [phone]   Routine Medical Office Visit  Patient:  Brett Rubio      Age: 42 y.o.       Sex:  male  Date:   05/04/2022  PCP:    Loralee Pacas, MD   [place] Provider: Loralee Pacas, MD   Problem Focused Charting:   Medical Decision Making per Assessment/Plan   Izan was seen today for 1  month follow-up.  Moderate episode of recurrent major depressive disorder (HCC) -     ARIPiprazole; Take 1 tablet (2 mg total) by mouth daily.  Dispense: 30 tablet; Refill: 2  Moderate alcohol use disorder (HCC) Overview: History of delirium tremens Did medical detox with librium/trazodone, [facility]. Not a fan of 12 steps Commitment to recovery is for his family Gabapentin/acamprosate helpful Traveling a lot for job ([employer]) can be tempting and burnout for family.   Assessment & Plan: Encouraged continuing with vitamin supplement Encouraged continuing with exercise and add resistance Encouraged doing personal sleep hygiene analysis, try to use cbt and sleep trackers on phone.\      05/04/2022   10:43 AM 05/04/2022   10:22 AM 03/03/2022   11:56 AM 02/09/2022    5:53 PM  GAD 7 : Generalized Anxiety Score  Nervous, Anxious, on Edge 2 2 2    Control/stop worrying 2 2 2    Worry too much - different things 2 2 2    Trouble relaxing 1 2 1    Restless 1 1 2    Easily annoyed or irritable 3 3 1    Afraid - awful might happen 2 2 3    Total GAD 7 Score 13 14 13    Anxiety Difficulty Somewhat difficult Somewhat difficult Very difficult      Information is confidential and restricted. Go to Review Flowsheets to unlock data.  Due to not much improvement in anxiety and its associated with relapses on alcohol, we will work towards increased medications, exercise, sleep quality analysis     History of ADHD Assessment & Plan: requested medication record(s) from Dr. Harrington Challenger confirming diagnosis .  Offered to resume ritalin / Adderall  controlled substance(s) management once we have confirmation of diagnosis  Sent to psychiatry, seems this wasn't discussed at that appointment. Don't have record(s) of what transpired        Subjective - Clinical Presentation:   Brett Rubio is a 42 y.o. male  Patient Active Problem List   Diagnosis Date Noted   MDD (major depressive disorder) 02/09/2022   GAD (generalized anxiety disorder) 02/09/2022   Mood disorder ([place]) 12/29/2021   History of ADHD 12/29/2021   Abnormal PSA 12/29/2021   Family history of prostate cancer 12/29/2021   Persistent depressive disorder with melancholic features, currently severe 12/29/2021   Vitamin deficiency 12/29/2021   Moderate alcohol use disorder ([place]) 11/26/2021   Crohn's disease (regional enteritis) ([place]) 11/11/2015   History of pseudomembranous enterocolitis 11/11/2015   GERD (gastroesophageal reflux disease) 11/10/2015   INSOMNIA 11/23/2007   Past Medical History:  Diagnosis Date   Alcohol dependence ([place]) 11/26/2021   History of delirium tremens Did medical detox with librium/trazodone, [facility]. Not a fan of 12 steps    History of ADHD 12/29/2021    Outpatient Medications Prior to Visit  Medication Sig Note   acamprosate (CAMPRAL) 333 MG tablet Take 2 tablets (666 mg total) by mouth 3 (three) times daily with meals. As needed only, to relieve stress and reduce alcohol craving  albuterol (VENTOLIN HFA) 108 (90 Base) MCG/ACT inhaler SMARTSIG:1 Puff(s) Via Inhaler Every 4 Hours PRN    escitalopram (LEXAPRO) 20 MG tablet Take 1 tablet (20 mg total) by mouth daily.    gabapentin (NEURONTIN) 300 MG capsule Take 1 capsule (300 mg total) by mouth 3 (three) times daily.    HUMIRA, 2 PEN, 40 MG/0.4ML PNKT SMARTSIG:40 Milligram(s) SUB-Q Every 2 Weeks    HUMIRA-CD/UC/HS STARTER 80 MG/0.8ML PNKT Inject into the skin.    Multiple Vitamins-Minerals (MULTIVITAMIN ADULTS PO) Take 1 tablet by mouth daily.    traZODone (DESYREL) 50 MG tablet  Take 1 tablet (50 mg total) by mouth at bedtime as needed.    valACYclovir (VALTREX) 500 MG tablet Take 500 mg by mouth every morning.    [DISCONTINUED] Mesalamine 800 MG TBEC Take by mouth. 05/04/2022: humira amazing   No facility-administered medications prior to visit.  Medication reconciliation completed today.    Chief Complaint  Patient presents with   1  month follow-up    HPI  Had "bobble" with alcohol for about a week.   Realized he doesn't like it that much Doing well on recovery despite the bobble set back. Exercising a llittle bit- but mostly at work. Insomnia well controlled but still using trazodone and trialing days off. Also still taking melatonin. Anxiety not much better despite complying with medication prescribed        Objective:  Physical Exam  BP 107/67 (BP Location: Left Arm, Patient Position: Sitting)   Pulse 63   Temp 97.9 F (36.6 C) (Temporal)   Ht 5\' 9"  (1.753 m)   Wt 160 lb (72.6 kg)   SpO2 99%   BMI 23.63 kg/m  Well developed, well nourished  by BMI criteria but truncal adiposity (waist circumference or caliper) should be used instead. Wt Readings from Last 10 Encounters:  05/04/22 160 lb (72.6 kg)  03/03/22 162 lb 3.2 oz (73.6 kg)  01/29/22 162 lb 12.8 oz (73.8 kg)  12/29/21 166 lb (75.3 kg)  11/26/21 161 lb (73 kg)   Vital signs reviewed.  Nursing notes reviewed. Weight trend reviewed. General Appearance:  Well developed, well nourished male in no acute distress.   Normal work of breathing at rest Musculoskeletal: All extremities are intact.  Neurological:  Awake, alert,  No obvious focal neurological deficits or cognitive impairments Psychiatric:  Appropriate mood, pleasant demeanor Problem-specific findings:  doing well       Signed: Loralee Pacas, MD 05/04/2022 9:19 PM

## 2022-05-04 NOTE — Assessment & Plan Note (Addendum)
Encouraged continuing with vitamin supplement Encouraged continuing with exercise and add resistance Encouraged doing personal sleep hygiene analysis, try to use cbt and sleep trackers on phone.\      05/04/2022   10:43 AM 05/04/2022   10:22 AM 03/03/2022   11:56 AM 02/09/2022    5:53 PM  GAD 7 : Generalized Anxiety Score  Nervous, Anxious, on Edge 2 2 2    Control/stop worrying 2 2 2    Worry too much - different things 2 2 2    Trouble relaxing 1 2 1    Restless 1 1 2    Easily annoyed or irritable 3 3 1    Afraid - awful might happen 2 2 3    Total GAD 7 Score 13 14 13    Anxiety Difficulty Somewhat difficult Somewhat difficult Very difficult      Information is confidential and restricted. Go to Review Flowsheets to unlock data.  Due to not much improvement in anxiety and its associated with relapses on alcohol, we will work towards increased medications, exercise, sleep quality analysis

## 2022-05-09 ENCOUNTER — Ambulatory Visit (HOSPITAL_COMMUNITY): Payer: BC Managed Care – PPO

## 2022-06-18 IMAGING — US US ABDOMEN LIMITED
1 series · 14 of 25 positions shown · non-contrast
Comparison: No prior.

CLINICAL DATA: Elevated LFTs.

EXAM:
ULTRASOUND ABDOMEN LIMITED RIGHT UPPER QUADRANT

[Series 1: us abdomen limited · 0.22mm/px · 14 of 48 slices shown]
[im 1/48]
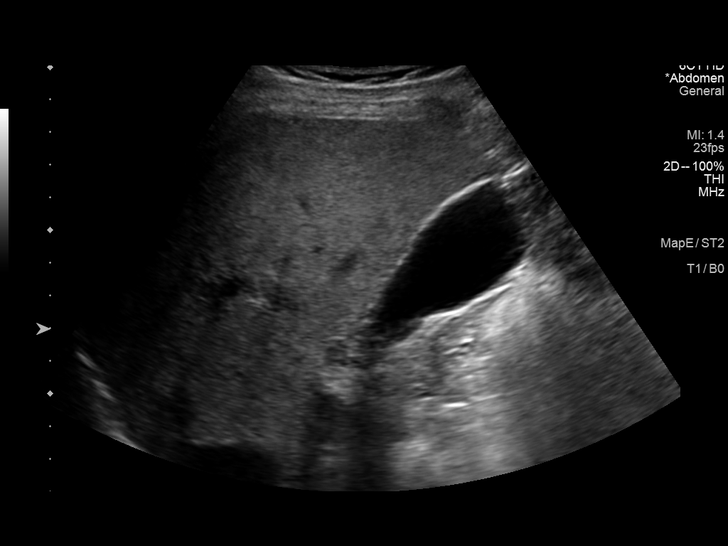
[im 4/48]
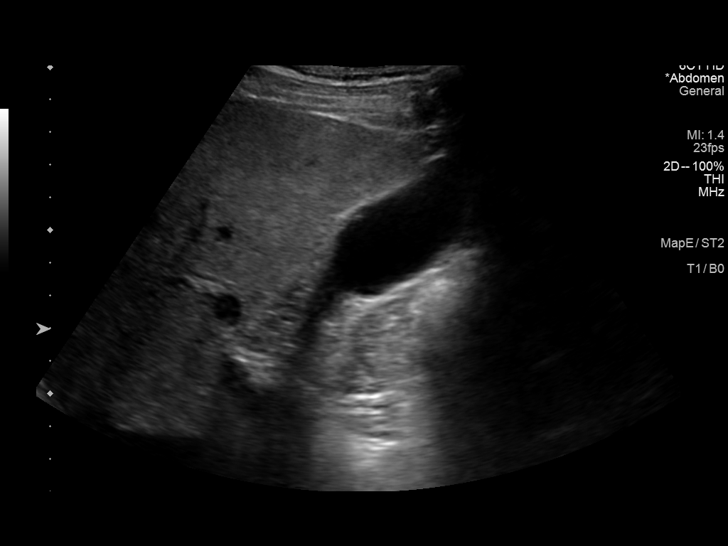
[im 8/48]
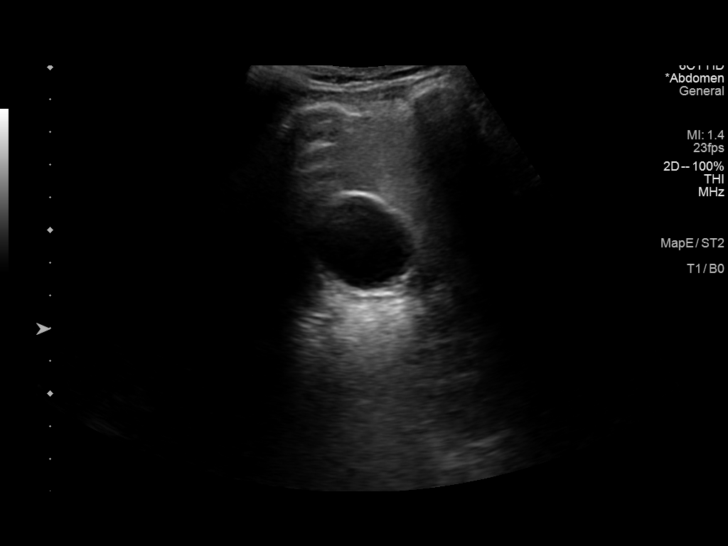
[im 12/48]
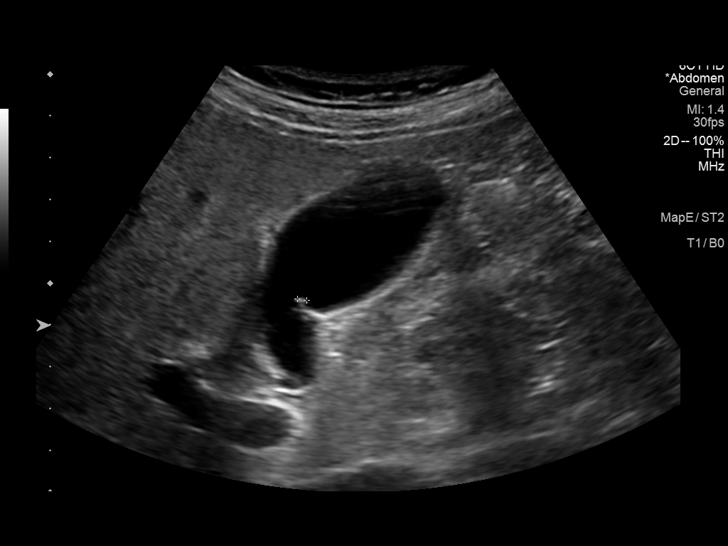
[im 16/48]
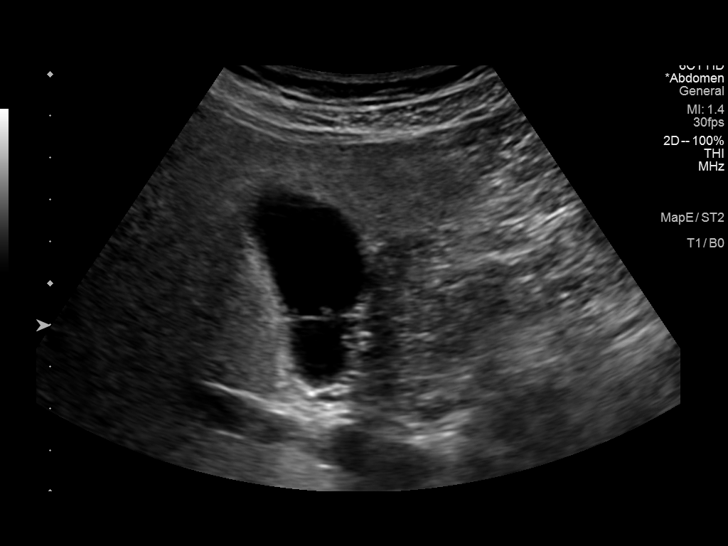
[im 18/48]
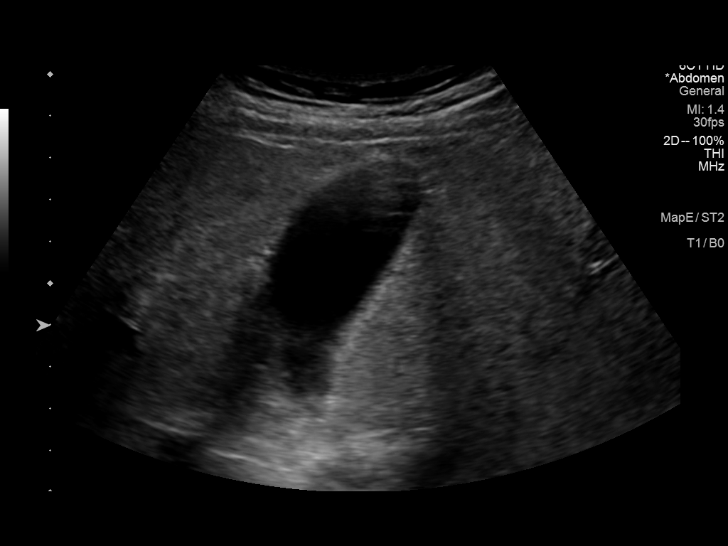
[im 22/48]
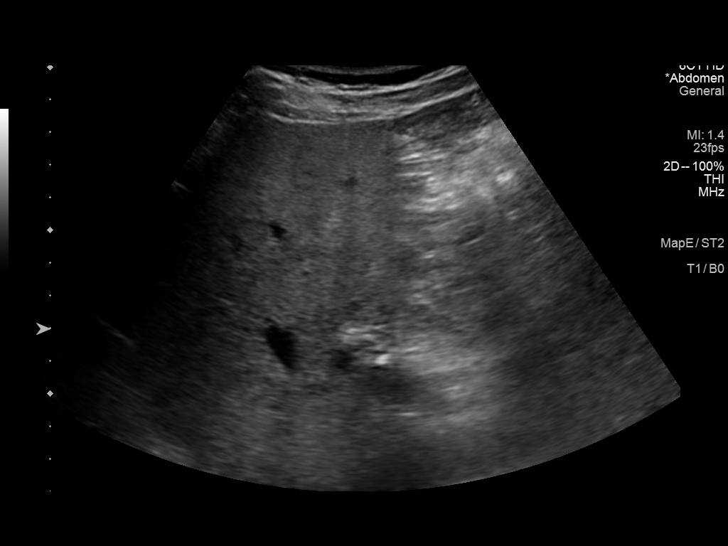
[im 26/48]
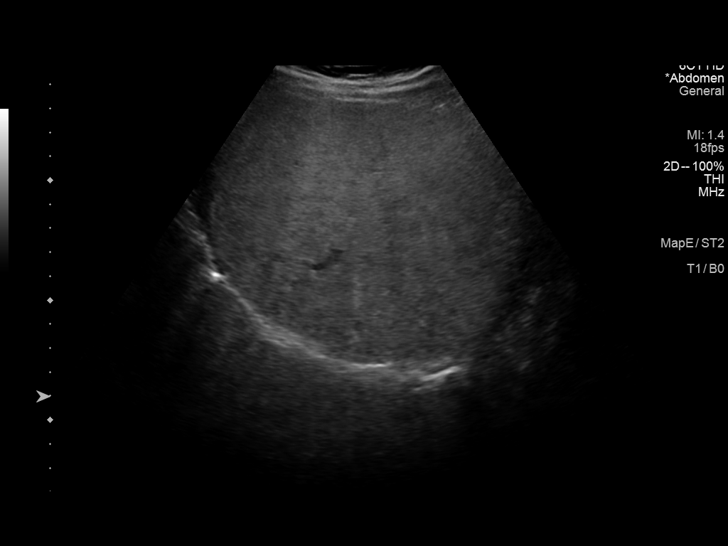
[im 30/48]
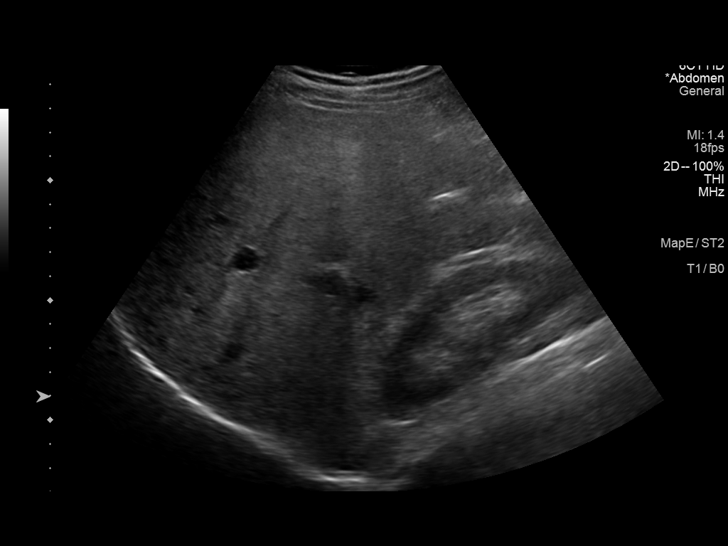
[im 32/48]
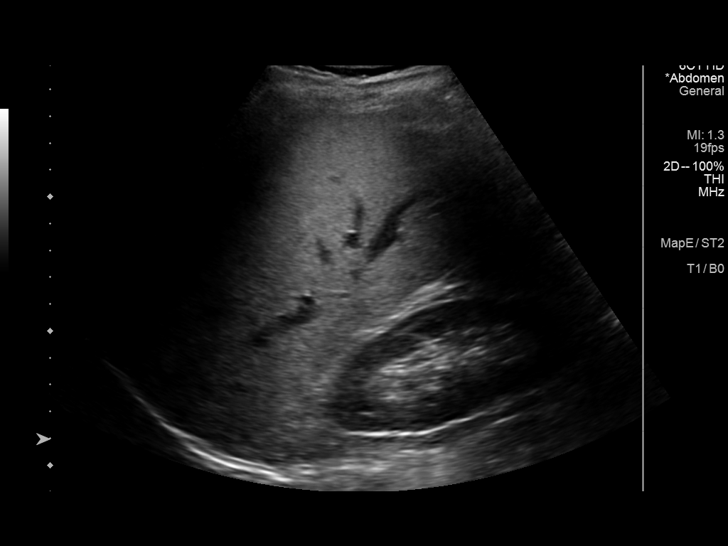
[im 36/48]
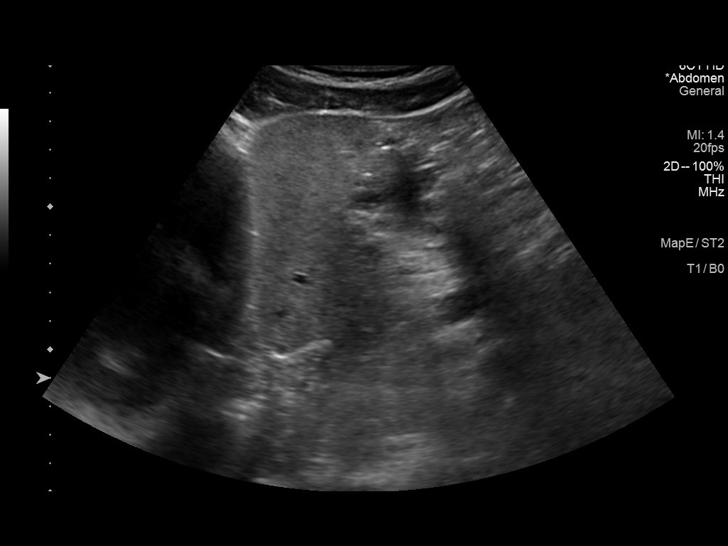
[im 40/48]
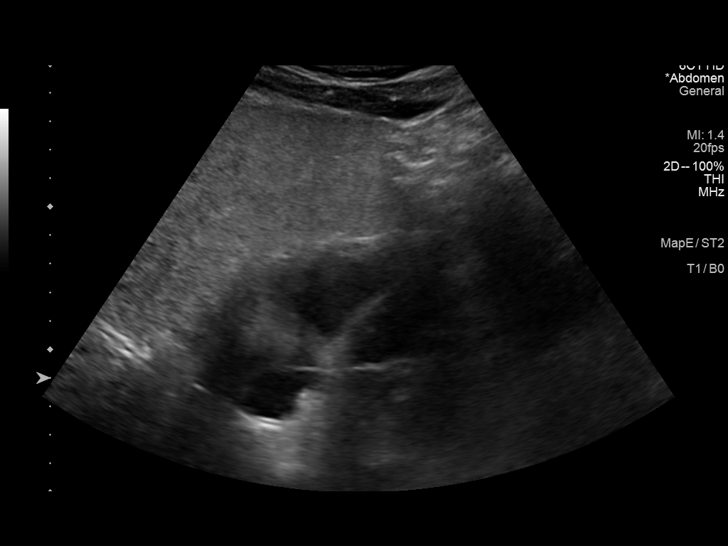
[im 44/48]
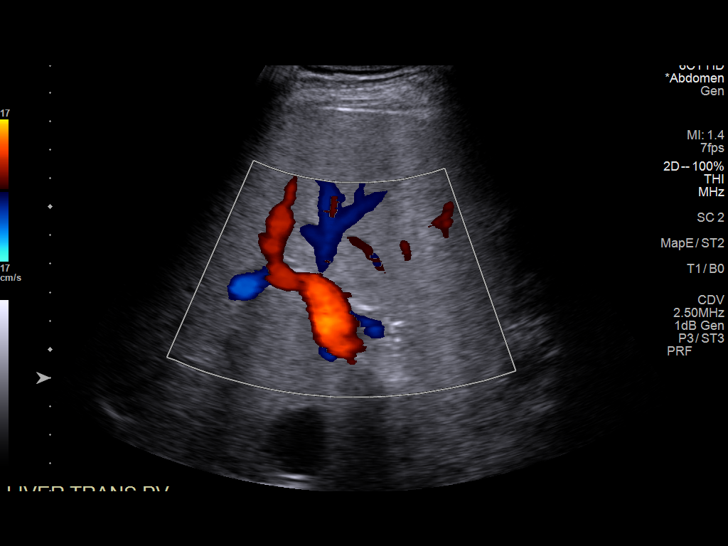
[im 48/48]
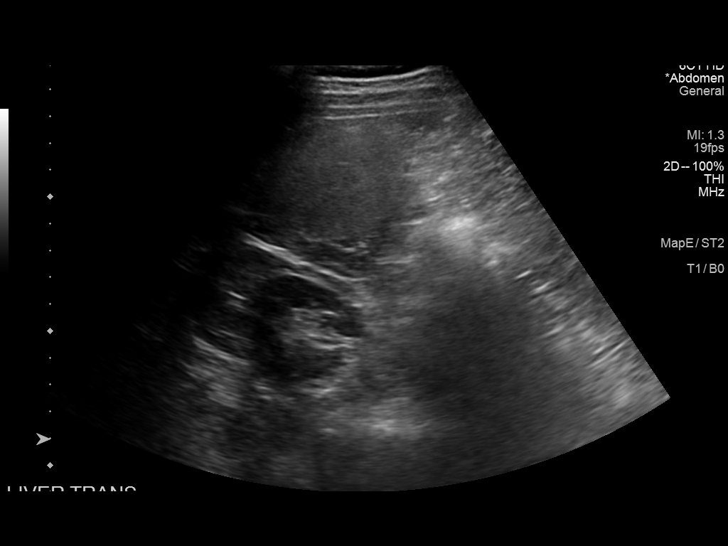

[14 of 25 positions shown; findings below may reference images not displayed]

FINDINGS: Gallbladder:

No gallstones or wall thickening visualized. Tiny non mobile
polypoid densities with the largest measuring 2.9 mm. These are most
likely tiny polyps. No sonographic Murphy sign noted by sonographer.

Common bile duct:

Diameter: 1.4 mm

Liver:

Increased hepatic echogenicity consistent fatty infiltration or
hepatocellular disease. No focal hepatic abnormality identified.
Portal vein is patent on color Doppler imaging with normal direction
of blood flow towards the liver.

Other: None.
IMPRESSION: 1. No gallstones or biliary distention. Tiny gallbladder polyps
noted. No biliary distention.

2. Increased hepatic echogenicity consistent fatty infiltration or
hepatocellular disease.

## 2022-07-10 ENCOUNTER — Encounter: Payer: Self-pay | Admitting: Internal Medicine

## 2022-07-10 ENCOUNTER — Ambulatory Visit: Payer: BC Managed Care – PPO | Admitting: Internal Medicine

## 2022-07-10 VITALS — BP 122/84 | HR 87 | Temp 98.2°F | Ht 69.0 in | Wt 157.2 lb

## 2022-07-10 DIAGNOSIS — F411 Generalized anxiety disorder: Secondary | ICD-10-CM

## 2022-07-10 DIAGNOSIS — F1020 Alcohol dependence, uncomplicated: Secondary | ICD-10-CM

## 2022-07-10 DIAGNOSIS — K219 Gastro-esophageal reflux disease without esophagitis: Secondary | ICD-10-CM | POA: Diagnosis not present

## 2022-07-10 DIAGNOSIS — K50919 Crohn's disease, unspecified, with unspecified complications: Secondary | ICD-10-CM | POA: Diagnosis not present

## 2022-07-10 DIAGNOSIS — Z8659 Personal history of other mental and behavioral disorders: Secondary | ICD-10-CM

## 2022-07-10 DIAGNOSIS — E569 Vitamin deficiency, unspecified: Secondary | ICD-10-CM

## 2022-07-10 DIAGNOSIS — F331 Major depressive disorder, recurrent, moderate: Secondary | ICD-10-CM | POA: Diagnosis not present

## 2022-07-10 DIAGNOSIS — R972 Elevated prostate specific antigen [PSA]: Secondary | ICD-10-CM

## 2022-07-10 DIAGNOSIS — G4709 Other insomnia: Secondary | ICD-10-CM

## 2022-07-10 NOTE — Patient Instructions (Addendum)
Key points from today: Next step  are to stay aggressive on vitamins, and to try to cut out the Prilosec and switch to Pepcid Complete to enable you to absorb those vitamins better, continue focusing on optimizing your sleep scheduling, and getting those records so that we can treat the ADHD, and continuing to participate in Smart recovery and work towards a goal of even less alcohol preferably never binge.  It was a pleasure seeing you today!  Your health and satisfaction are my top priorities. If you believe your experience today was worthy of a 5-star rating, I'd be grateful for your feedback! Lula Olszewski, MD   Next Steps: Schedule Follow-Up:  We recommend a follow-up appointment in No follow-ups on file.. If your condition worsens before then, please call us or seek emergency care.   Preventive Care:  Don't forget to schedule your annual preventive care visit!  This important checkup is typically covered by insurance and helps identify potential health issues early.  Typically its 100% insurance covered with no co-pay and helps to get surveillance labwork paid for.  Lab & X-ray Appointments:  Scheduled any incomplete lab tests today or call us to schedule.  XRays can be done without an appointment at [facility] ([address]), M-F 8:30am-noon or 1pm-5pm.  Just tell them you're there for X-rays ordered by Dr. Jon Billings.  We'll receive the results and contact you by phone or MyChart to discuss next steps.  Medical Information Release:  If you have any relevant medical information we don't have, please sign a release form so we can obtain it for your records.  Bring to Your Next Appointment: Medications: Please bring all your medication bottles to your next appointment to ensure we have an accurate record of your prescriptions. Health Diaries: If you're monitoring any health conditions at home, keeping a diary of your readings can be very helpful for discussions at  your next appointment.  Please Review your early draft clinical notes below and the final encounter summary tomorrow on MyChart after its been completed.   Moderate episode of recurrent major depressive disorder Assessment & Plan: Has been to psychiatry, but plans to discontinue which I offered to support in any way I can such as taking over medication(s) prescribing  We briefly discussed the potential benefits of behavioral health counselor  but the patient politely declined. We will revisit this option if needed at future visits or he changes his mind.  Stable, mild, not improving or worsening Offered to adjust medication(s)    History of ADHD Assessment & Plan: We briefly discussed the today he wanted to know if we had received the records that he released from Dr. Tenny Craw confirming his ADHD from high school and I searched and could not find them so I advised him that the best way to get them would be to go to their office and request them.  I also said that if he can bring me anything else that confirms the diagnosis I will take it and another option is to go to [place] attention specialist to get a confirmed adult ADHD diagnosis redone although it is a little pricey.I am happy to lower the cost in the long run by prescribing the medication at our primary care visits once I have the confirmation   Crohn's disease with complication, unspecified gastrointestinal tract location  Gastroesophageal reflux disease, unspecified whether esophagitis present Assessment & Plan: Explained I prefer for him to switch to Pepcid  Complete if Dr. Elnoria Howard has no problem with that since it is better for as needed use and as needed use only enables the better absorption of many vitamins which are prevented by chronically suppressing acid in the stomach with Prilosec omeprazole   Other insomnia  Moderate alcohol use disorder Assessment & Plan: Encouraged continuing with smart recovery and I think that abstinence  should be a goal at this point but I will work towards what ever goals he feels are most appropriate for him. Continue with gabapentin and acamprosate at least and tell alcohol free for several months would be my recommendation and may be long-term Continue with close sleep management monitoring advised him not to cut back the trazodone too quickly because lack of sleep is a risk from the past Encouraged him to keep up with the waking up early strategy and keep good sleep hygiene Offered to do anything else he thought might be helpful for him and he said I am good   Abnormal PSA  Vitamin deficiency     Getting Answers and Following Up: Simple Questions & Concerns: For quick questions or basic follow-up after your visit, reach [place] at [phone] or MyChart messaging. Complex Concerns: If your concern is more complex, scheduling an appointment might be best. Discuss this with the staff to find the most suitable option. Lab & Imaging Results: We'll contact you directly if results are abnormal or you don't use MyChart. Most normal results will be on MyChart within 2-3 business days, with a review message from Dr. Jon Billings. Haven't heard back in 2 weeks? Need results sooner? Contact us at [phone]. Referrals: Our referral coordinator will manage specialist referrals. The specialist's office should contact you within 2 weeks to schedule an appointment. Call us if you haven't heard from them after 2 weeks.  Staying Connected:  MyChart: Activate your MyChart for the fastest way to access results and message [place]. See the last page of this paperwork for instructions.  Billing: X-ray & Lab Orders: These are billed by separate companies. Contact the invoicing company directly for questions or concerns. Visit Charges: Discuss any billing inquiries with our administrative services team.  Feedback & Satisfaction: Share Your Experience: We strive for your satisfaction! If you have any complaints,  please let Dr. Jon Billings know directly or contact our Practice Administrators, Edwena Felty or [employer], by asking at the front desk.  Scheduling Tips: Shorter Wait Times: 8 am and 1 pm appointments often have the quickest wait times. Longer Appointments: If you need more time during your visit, talk to the front desk. Due to insurance regulations, multiple back-to-back appointments might be necessary.

## 2022-07-10 NOTE — Assessment & Plan Note (Signed)
Encouraged continuing with smart recovery and I think that abstinence should be a goal at this point but I will work towards what ever goals he feels are most appropriate for him. Continue with gabapentin and acamprosate at least and tell alcohol free for several months would be my recommendation and may be long-term Continue with close sleep management monitoring advised him not to cut back the trazodone too quickly because lack of sleep is a risk from the past Encouraged him to keep up with the waking up early strategy and keep good sleep hygiene Offered to do anything else he thought might be helpful for him and he said I am good

## 2022-07-10 NOTE — Progress Notes (Signed)
[facility]: [phone]   Routine Medical Office Visit  Patient:  Brett Rubio      Age: 42 y.o.       Sex:  male  Date:   07/10/2022 PCP:    Lula Olszewski, MD   Today's Healthcare Provider: Lula Olszewski, MD   Assessment and Plan:   Moderate episode of recurrent major depressive disorder Assessment & Plan: Has been to psychiatry, but plans to discontinue which I offered to support in any way I can such as taking over medication(s) prescribing  We briefly discussed the potential benefits of behavioral health counselor  but the patient politely declined. We will revisit this option if needed at future visits or he changes his mind.  Stable, mild, not improving or worsening Offered to adjust medication(s)    History of ADHD Assessment & Plan: We briefly discussed the today he wanted to know if we had received the records that he released from Dr. Tenny Craw confirming his ADHD from high school and I searched and could not find them so I advised him that the best way to get them would be to go to their office and request them.  I also said that if he can bring me anything else that confirms the diagnosis I will take it and another option is to go to [facility] to get a confirmed adult ADHD diagnosis redone although it is a little pricey.I am happy to lower the cost in the long run by prescribing the medication at our primary care visits once I have the confirmation   Crohn's disease with complication, unspecified gastrointestinal tract location  Gastroesophageal reflux disease, unspecified whether esophagitis present Assessment & Plan: Explained I prefer for him to switch to Pepcid Complete if Dr. Elnoria Howard has no problem with that since it is better for as needed use and as needed use only enables the better absorption of many vitamins which are prevented by chronically suppressing acid in the stomach with Prilosec omeprazole   Other  insomnia  Moderate alcohol use disorder Assessment & Plan: Encouraged continuing with smart recovery and I think that abstinence should be a goal at this point but I will work towards what ever goals he feels are most appropriate for him. Continue with gabapentin and acamprosate at least and tell alcohol free for several months would be my recommendation and may be long-term Continue with close sleep management monitoring advised him not to cut back the trazodone too quickly because lack of sleep is a risk from the past Encouraged him to keep up with the waking up early strategy and keep good sleep hygiene Offered to do anything else he thought might be helpful for him and he said I am good   Abnormal PSA  Vitamin deficiency  GAD (generalized anxiety disorder) Assessment & Plan: We briefly discussed the potential benefits of  changing mood medicine but the patient politely declined. We will revisit this option if needed at future visits or he changes his mind.          Clinical Presentation:   42 y.o. male here today for 2 month follow-up  Reviewed:  has a past medical history of Abnormal PSA (12/29/2021), Alcohol dependence (11/26/2021), History of ADHD (12/29/2021), History of pseudomembranous enterocolitis (11/11/2015), and Persistent depressive disorder with melancholic features, currently severe (12/29/2021). Active Ambulatory Problems    Diagnosis Date Noted   INSOMNIA 11/23/2007   Crohn's disease (regional enteritis) 11/11/2015   GERD (gastroesophageal reflux disease)  11/10/2015   Moderate alcohol use disorder 11/26/2021   History of ADHD 12/29/2021   Family history of prostate cancer 12/29/2021   Vitamin deficiency 12/29/2021   MDD (major depressive disorder) 12/29/2021   GAD (generalized anxiety disorder) 02/09/2022   Resolved Ambulatory Problems    Diagnosis Date Noted   History of pseudomembranous enterocolitis 11/11/2015   Abnormal PSA 12/29/2021   Persistent  depressive disorder with melancholic features, currently severe 12/29/2021   Past Medical History:  Diagnosis Date   Alcohol dependence 11/26/2021    Outpatient Medications Prior to Visit  Medication Sig   acamprosate (CAMPRAL) 333 MG tablet Take 2 tablets (666 mg total) by mouth 3 (three) times daily with meals. As needed only, to relieve stress and reduce alcohol craving   albuterol (VENTOLIN HFA) 108 (90 Base) MCG/ACT inhaler SMARTSIG:1 Puff(s) Via Inhaler Every 4 Hours PRN   ARIPiprazole (ABILIFY) 2 MG tablet Take 1 tablet (2 mg total) by mouth daily.   escitalopram (LEXAPRO) 20 MG tablet Take 1 tablet (20 mg total) by mouth daily.   gabapentin (NEURONTIN) 300 MG capsule Take 1 capsule (300 mg total) by mouth 3 (three) times daily.   HUMIRA, 2 PEN, 40 MG/0.4ML PNKT SMARTSIG:40 Milligram(s) SUB-Q Every 2 Weeks   HUMIRA-CD/UC/HS STARTER 80 MG/0.8ML PNKT Inject into the skin.   Multiple Vitamins-Minerals (MULTIVITAMIN ADULTS PO) Take 1 tablet by mouth daily.   traZODone (DESYREL) 50 MG tablet Take 1 tablet (50 mg total) by mouth at bedtime as needed.   valACYclovir (VALTREX) 500 MG tablet Take 500 mg by mouth every morning.   No facility-administered medications prior to visit.    HPI  Updated and modified:  Problem  Gad (Generalized Anxiety Disorder)      07/10/2022    8:41 AM 05/04/2022   10:43 AM 05/04/2022   10:22 AM 03/03/2022   11:56 AM  GAD 7 : Generalized Anxiety Score  Nervous, Anxious, on Edge Control/stop worrying Worry too much - different things Trouble relaxing Restless Easily annoyed or irritable Afraid - awful might happen Total GAD 7 Score Anxiety Difficulty Somewhat difficult Somewhat difficult Somewhat difficult Very difficult       History of Adhd  Vitamin Deficiency   Associated with Crohn's disease malabsorption and chronic consumption of omeprazole and history of alcohol use.   Given all of these issues I encouraged the patient to take a multivitamin lifelong whereas I generally do not have everyone do that   Mdd (Major Depressive Disorder)      07/10/2022    8:41 AM 05/04/2022   10:21 AM 02/09/2022    5:52 PM 11/26/2021    9:08 AM  Depression screen PHQ 2/9  Decreased Interest 2 2  0  Down, Depressed, Hopeless 1 2  0  PHQ - 2 Score 3 4  0  Altered sleeping 2 1    Tired, decreased energy 2 3    Change in appetite 1 1    Feeling bad or failure about yourself  1 1    Trouble concentrating 1 1    Moving slowly or fidgety/restless 1 1    Suicidal thoughts 0 0    PHQ-9 Score 11 12    Difficult doing work/chores Somewhat difficult Somewhat difficult  Information is confidential and restricted. Go to Review Flowsheets to unlock data.      Moderate Alcohol Use Disorder   Patient is imperfect control but doing really well and participating in Smart recovery programs History of delirium tremens [facility] Commitment to recovery is for his family Gabapentin/acamprosate helpful Traveling a lot for job ([employer]) can be tempting and burnout for family.    Crohn's Disease (Regional Enteritis)   Patient reports no flares of his Crohn's since last visit He has been managed long-term by Dr. Glorianne Manchester He reports compliance with Humira and excellent effectiveness Colon involvement 2017    Gerd (Gastroesophageal Reflux Disease)   He takes omeprazole over-the-counter long-term. Dr. Elnoria Howard does not have a problem with it   INSOMNIA   Still doing better on this and still using trazodone trying to reduce usage to as needed only Trazodone helps a lot Major source of triggering for alcohol use in the past    Abnormal Psa (Resolved)   Lab Results  Component Value Date   PSA 2.37 12/29/2021       Persistent Depressive Disorder With Melancholic Features, Currently Severe (Resolved)  History  of Pseudomembranous Enterocolitis (Resolved)             Clinical Data Analysis:   Physical Exam  BP 122/84 (BP Location: Left Arm, Patient Position: Sitting)   Pulse 87   Temp 98.2 F (36.8 C) (Temporal)   Ht 5\' 9"  (1.753 m)   Wt 157 lb 3.2 oz (71.3 kg)   SpO2 97%   BMI 23.21 kg/m  Wt Readings from Last 10 Encounters:  07/10/22 157 lb 3.2 oz (71.3 kg)  05/04/22 160 lb (72.6 kg)  03/03/22 162 lb 3.2 oz (73.6 kg)  01/29/22 162 lb 12.8 oz (73.8 kg)  12/29/21 166 lb (75.3 kg)  11/26/21 161 lb (73 kg)   Vital signs reviewed.  Nursing notes reviewed. Weight trend reviewed. Abnormalities and Problem-Specific physical exam findings:  seems more upbeat than prior General Appearance:  No acute distress appreciable.   Well-groomed, healthy-appearing male.  Well proportioned with no abnormal fat distribution.  Good muscle tone. Skin: Clear and well-hydrated. Pulmonary:  Normal work of breathing at rest, no respiratory distress apparent. SpO2: 97 %  Musculoskeletal: Patient demonstrates smooth and coordinated movements throughout all major joints.All extremities are intact.  Neurological:  Awake, alert, oriented, and engaged.  No obvious focal neurological deficits or cognitive impairments.  Sensorium seems unclouded. Gait is smooth and coordinated.  Speech is clear and coherent with logical content. Psychiatric:  Appropriate mood, pleasant and cooperative demeanor, cheerful and engaged during the exam   --------------------------------    Signed: Lula Olszewski, MD 07/10/2022 9:12 AM

## 2022-07-10 NOTE — Assessment & Plan Note (Signed)
Explained I prefer for him to switch to Pepcid Complete if Dr. Elnoria Howard has no problem with that since it is better for as needed use and as needed use only enables the better absorption of many vitamins which are prevented by chronically suppressing acid in the stomach with Prilosec omeprazole

## 2022-07-10 NOTE — Assessment & Plan Note (Signed)
We briefly discussed the potential benefits of  changing mood medicine but the patient politely declined. We will revisit this option if needed at future visits or he changes his mind.

## 2022-07-10 NOTE — Assessment & Plan Note (Signed)
Has been to psychiatry, but plans to discontinue which I offered to support in any way I can such as taking over medication(s) prescribing  We briefly discussed the potential benefits of behavioral health counselor  but the patient politely declined. We will revisit this option if needed at future visits or he changes his mind.  Stable, mild, not improving or worsening Offered to adjust medication(s)

## 2022-07-10 NOTE — Assessment & Plan Note (Signed)
We briefly discussed the today he wanted to know if we had received the records that he released from Dr. Tenny Craw confirming his ADHD from high school and I searched and could not find them so I advised him that the best way to get them would be to go to their office and request them.  I also said that if he can bring me anything else that confirms the diagnosis I will take it and another option is to go to [facility] to get a confirmed adult ADHD diagnosis redone although it is a little pricey.I am happy to lower the cost in the long run by prescribing the medication at our primary care visits once I have the confirmation

## 2022-07-29 ENCOUNTER — Other Ambulatory Visit: Payer: Self-pay | Admitting: Internal Medicine

## 2022-07-29 DIAGNOSIS — F331 Major depressive disorder, recurrent, moderate: Secondary | ICD-10-CM

## 2022-09-10 ENCOUNTER — Encounter: Payer: Self-pay | Admitting: Internal Medicine

## 2022-09-10 ENCOUNTER — Ambulatory Visit: Payer: BC Managed Care – PPO | Admitting: Internal Medicine

## 2022-09-10 VITALS — BP 104/80 | HR 70 | Temp 97.7°F | Ht 69.0 in | Wt 162.0 lb

## 2022-09-10 DIAGNOSIS — F324 Major depressive disorder, single episode, in partial remission: Secondary | ICD-10-CM | POA: Diagnosis not present

## 2022-09-10 DIAGNOSIS — Z0000 Encounter for general adult medical examination without abnormal findings: Secondary | ICD-10-CM

## 2022-09-10 DIAGNOSIS — F5101 Primary insomnia: Secondary | ICD-10-CM

## 2022-09-10 DIAGNOSIS — R41840 Attention and concentration deficit: Secondary | ICD-10-CM

## 2022-09-10 DIAGNOSIS — K5000 Crohn's disease of small intestine without complications: Secondary | ICD-10-CM

## 2022-09-10 DIAGNOSIS — B0230 Zoster ocular disease, unspecified: Secondary | ICD-10-CM

## 2022-09-10 DIAGNOSIS — Z8659 Personal history of other mental and behavioral disorders: Secondary | ICD-10-CM

## 2022-09-10 DIAGNOSIS — F1020 Alcohol dependence, uncomplicated: Secondary | ICD-10-CM

## 2022-09-10 MED ORDER — VALACYCLOVIR HCL 500 MG PO TABS: 500.0000 mg | ORAL_TABLET | Freq: Every morning | ORAL | 3 refills | Status: DC

## 2022-09-10 NOTE — Assessment & Plan Note (Addendum)
No history bowel resection, doing well Defer to Dr. Elnoria Howard for continuation Humira Crohn's Disease: He is stable on Humira, managed by Dr. Jeani Hawking. We will continue Humira as prescribed by Dr. Elnoria Howard.

## 2022-09-10 NOTE — Assessment & Plan Note (Addendum)
Recommend trial reduce trazodone, follow up 3 months if unable to consider ALT antidepressant Iimproving, controlled on  3 medications Depression: He is in partial remission with the current regimen of Lexapro and Abilify. We will continue Lexapro 20mg  and Abilify as prescribed and consider reducing Trazodone, Lexapro, or Abilify if he desires and his mood remains stable.

## 2022-09-10 NOTE — Progress Notes (Signed)
[facility]: [phone]   Routine Medical Office Visit  Patient:  Brett Rubio      Age: 42 y.o.       Sex:  male  Date:   09/10/2022 PCP:    Lula Olszewski, MD   Today's Healthcare Provider: Lula Olszewski, MD   Assessment and Plan:    Brett Rubio was seen today for 2 month follow-up, depression and alcohol problem.  Major depressive disorder in partial remission, unspecified whether recurrent (HCC) Overview:    09/10/2022    9:21 AM 09/10/2022    9:07 AM 07/10/2022    8:41 AM 05/04/2022   10:21 AM 02/09/2022    5:52 PM  Depression screen PHQ 2/9  Decreased Interest 1 0 2 2   Down, Depressed, Hopeless 0 0 1 2   PHQ - 2 Score 1 0 3 4   Altered sleeping 0  2 1   Tired, decreased energy 2  2 3    Change in appetite 1  1 1    Feeling bad or failure about yourself  0  1 1   Trouble concentrating 1  1 1    Moving slowly or fidgety/restless 0  1 1   Suicidal thoughts 0  0 0   PHQ-9 Score 5  11 12    Difficult doing work/chores Not difficult at all  Somewhat difficult Somewhat difficult      Information is confidential and restricted. Go to Review Flowsheets to unlock data.     Assessment & Plan: Recommend trial reduce trazodone, follow up 3 months if unable to consider ALT antidepressant Iimproving, controlled on  3 medications Depression: He is in partial remission with the current regimen of Lexapro and Abilify. We will continue Lexapro 20mg  and Abilify as prescribed and consider reducing Trazodone, Lexapro, or Abilify if he desires and his mood remains stable.   Crohn's disease of small intestine without complication (HCC) Overview: Patient reports no flares of his Crohn's since last visit He has been managed long-term by Dr. Jeani Hawking He reports compliance with Humira and excellent effectiveness Colon involvement 2017   Assessment & Plan: No history bowel resection, doing well Defer to Dr. Elnoria Howard for continuation Humira Crohn's Disease: He is  stable on Humira, managed by Dr. Jeani Hawking. We will continue Humira as prescribed by Dr. Elnoria Howard.   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   Assessment & Plan: We will start Valtrex 500mg  daily for prophylaxis.  Orders: -     valACYclovir HCl; Take 1 tablet (500 mg total) by mouth every morning.  Dispense: 90 tablet; Refill: 3  Concentration deficit -     Ambulatory referral to Psychiatry  Moderate alcohol use disorder [facility]) Overview: September 11, 2022 interim history:   reporting he is remaining in recovery, recently did 7 days vacation without alcohol and enjoyed it with his family, still taking gabapentin/acamprosate as prescribed.  Prior history:  Patient is imperfect control but doing really well and participating in Smart recovery programs History of delirium tremens [facility] Commitment to recovery is for his family Gabapentin/acamprosate helpful Traveling a lot for job ([employer]) can be tempting and burnout for family.   Assessment & Plan:  Alcohol Use Disorder: He maintains sobriety with SMART meetings and reports no recent relapses. We will continue Acamprosate and Gabapentin as prescribed and encourage him to  return promptly if any relapse occurs.   Primary insomnia Overview: Still doing better on this and still using trazodone trying to reduce usage to as needed only Trazodone helps a lot Major source of triggering for alcohol use in the past   Assessment & Plan: Insomnia: His insomnia is managed with Trazodone. We will continue Trazodone as needed for sleep and consider reducing it if his sleep remains stable.x   History of ADHD Assessment & Plan: ADHD: With historical records unavailable, he agrees to see a specialist for confirmation of diagnosis. We referred him to [facility] for an  ADHD evaluation and will consider initiation of ADHD medication upon confirmation of diagnosis.    Healthcare maintenance Assessment & Plan: General Health Maintenance / Followup Plans: We scheduled an annual wellness visit in 4 months. We encouraged him to maintain current lifestyle changes and continue SMART meetings. If the ADHD diagnosis is confirmed and treatment initiated by the specialist, a follow-up visit may be sooner.               Clinical Presentation:    42 y.o. male here today for 2 month follow-up, Depression (In remission now), and Alcohol Problem (In remission now)  AI-Extracted: Discussed the use of AI scribe software for clinical note transcription with the patient, who gave verbal consent to proceed.  History of Present Illness   The patient, with a history of alcohol misuse, ADHD, depression, and Crohn's disease, presents for a routine follow-up. He reports significant improvement in his overall health and mood over the past two months. He has been attending weekly SMART meetings and recently spent a week at the beach with his family, maintaining sobriety throughout. He reports no recent depressive or anxiety attacks and has been managing any arising stress effectively.  His Crohn's disease is managed by another provider with Humira, and he reports no recent flares. He has not had any bowel resections. 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 He has been prescribed Valtrex as a preventative measure.  The patient's insomnia is currently managed with Trazodone, and he reports a stable sleep schedule with early bedtimes and wake-ups. He also takes Lexapro and a low dose of Abilify for depression, which he believes has contributed to his improved mood. He has been able to cease his GERD medication, Omeprazole, due to a lack of symptoms.  The patient also reports a  significant lifestyle change, including a reduction in fast food consumption and an increase in self-reflection and coping strategies. He expresses satisfaction with his current medication regimen and lifestyle changes, attributing these factors to his improved health and mood.        Reviewed chart data: Active Ambulatory Problems    Diagnosis Date Noted   INSOMNIA 11/23/2007   Crohn's disease (regional enteritis) (HCC) 11/11/2015   Moderate alcohol use disorder (HCC) 11/26/2021   History of ADHD 12/29/2021   Family history of prostate cancer 12/29/2021   Vitamin deficiency 12/29/2021   MDD (major depressive disorder) 12/29/2021   GAD (generalized anxiety disorder) 02/09/2022   Ocular herpes 09/11/2022   Concentration deficit 09/11/2022   Healthcare maintenance 09/11/2022   Resolved Ambulatory Problems    Diagnosis Date Noted   GERD (gastroesophageal reflux disease) 11/10/2015   History of pseudomembranous enterocolitis 11/11/2015   Abnormal PSA 12/29/2021   Persistent depressive disorder with melancholic features, currently severe 12/29/2021   Past Medical  History:  Diagnosis Date   Alcohol dependence (HCC) 11/26/2021    Outpatient Medications Prior to Visit  Medication Sig   acamprosate (CAMPRAL) 333 MG tablet Take 2 tablets (666 mg total) by mouth 3 (three) times daily with meals. As needed only, to relieve stress and reduce alcohol craving   albuterol (VENTOLIN HFA) 108 (90 Base) MCG/ACT inhaler SMARTSIG:1 Puff(s) Via Inhaler Every 4 Hours PRN   ARIPiprazole (ABILIFY) 2 MG tablet TAKE 1 TABLET(2 MG) BY MOUTH DAILY   escitalopram (LEXAPRO) 20 MG tablet Take 1 tablet (20 mg total) by mouth daily.   gabapentin (NEURONTIN) 300 MG capsule Take 1 capsule (300 mg total) by mouth 3 (three) times daily.   HUMIRA, 2 PEN, 40 MG/0.4ML PNKT SMARTSIG:40 Milligram(s) SUB-Q Every 2 Weeks   HUMIRA-CD/UC/HS STARTER 80 MG/0.8ML PNKT Inject into the skin.   Multiple Vitamins-Minerals  (MULTIVITAMIN ADULTS PO) Take 1 tablet by mouth daily.   traZODone (DESYREL) 50 MG tablet Take 1 tablet (50 mg total) by mouth at bedtime as needed.   [DISCONTINUED] valACYclovir (VALTREX) 500 MG tablet Take 500 mg by mouth every morning.   No facility-administered medications prior to visit.         Clinical Data Analysis:   Physical Exam  BP 104/80 (BP Location: Left Arm, Patient Position: Sitting)   Pulse 70   Temp 97.7 F (36.5 C) (Temporal)   Ht 5\' 9"  (1.753 m)   Wt 162 lb (73.5 kg)   SpO2 99%   BMI 23.92 kg/m  Wt Readings from Last 10 Encounters:  09/10/22 162 lb (73.5 kg)  07/10/22 157 lb 3.2 oz (71.3 kg)  05/04/22 160 lb (72.6 kg)  03/03/22 162 lb 3.2 oz (73.6 kg)  01/29/22 162 lb 12.8 oz (73.8 kg)  12/29/21 166 lb (75.3 kg)  11/26/21 161 lb (73 kg)   Vital signs reviewed.  Nursing notes reviewed. Weight trend reviewed. Abnormalities and Problem-Specific physical exam findings:  seems in great spirits today compared to prior visits  General Appearance:  No acute distress appreciable.   Well-groomed, healthy-appearing male.  Well proportioned with no abnormal fat distribution.  Good muscle tone. Skin: Clear and well-hydrated. Pulmonary:  Normal work of breathing at rest, no respiratory distress apparent. SpO2: 99 %  Musculoskeletal: All extremities are intact.  Neurological:  Awake, alert, oriented, and engaged.  No obvious focal neurological deficits or cognitive impairments.  Sensorium seems unclouded.   Speech is clear and coherent with logical content. Psychiatric:  Appropriate mood, pleasant and cooperative demeanor, thoughtful and engaged during the exam  This encounter employed real-time, collaborative documentation. The patient actively reviewed and updated their medical record on a shared screen, ensuring transparency and facilitating joint problem-solving for the problem list, overview, and plan. This approach promotes accurate, informed care. The treatment plan  was discussed and reviewed in detail, including medication safety, potential side effects, and all patient questions. We confirmed understanding and comfort with the plan. Follow-up instructions were established, including contacting the office for any concerns, returning if symptoms worsen, persist, or new symptoms develop, and precautions for potential emergency department visits. ----------------------------------------------------- Lula Olszewski, MD  09/11/2022 7:54 AM  [facility]:  [phone]

## 2022-09-10 NOTE — Patient Instructions (Signed)
It was a pleasure seeing you today! Your health and satisfaction are our top priorities.   Glenetta Hew, MD  VISIT SUMMARY:  During your recent visit, we discussed your ongoing management of alcohol misuse, ADHD, depression, Crohn's disease, insomnia, and ocular herpes. You reported significant improvement in your overall health and mood, which is a testament to your commitment to attending weekly SMART meetings and making positive lifestyle changes. Your Crohn's disease is stable with Humira, and your insomnia is well-managed with Trazodone. You also reported no recent flares of ocular herpes, which is managed with Valtrex.  YOUR PLAN:  -ALCOHOL USE DISORDER: You're doing well maintaining sobriety with the help of SMART meetings. We'll continue your current medications, Acamprosate and Gabapentin, and encourage you to reach out if you experience any relapses.  -ADHD: We're referring you to [facility] for an ADHD evaluation. Once we have a confirmed diagnosis, we can consider starting medication for ADHD.  -DEPRESSION: Your depression is currently in partial remission with Lexapro and Abilify. We'll continue these medications and consider reducing the dosage if your mood remains stable.  -INSOMNIA: Your insomnia is well-managed with Trazodone. We'll continue this medication and consider reducing it if your sleep remains stable.  -CROHN'S DISEASE: Your Crohn's disease is stable with Humira, which is managed by Dr. Jeani Hawking. We'll continue this medication as prescribed by Dr. Elnoria Howard.  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 We'll start you on Valtrex for prevention.  -HYPERLIPIDEMIA: Your previous labs showed elevated cholesterol. We'll do a fasting lipid panel at your next visit in 4 months to monitor this.  INSTRUCTIONS:  We have scheduled an annual wellness visit in 4 months. Please continue  with your current lifestyle changes and SMART meetings. If the ADHD diagnosis is confirmed and treatment is initiated by the specialist, we may need to schedule a follow-up visit sooner.   Next Steps:  [x]  Early Intervention: Schedule sooner appointment, call our on-call services, or go to emergency room if there is Increase in pain or discomfort New or worsening symptoms Sudden or severe changes in your health [x]  Flexible Follow-Up: We recommend a No follow-ups on file. for optimal routine care. This allows for progress monitoring and treatment adjustments. [x]  Preventive Care: Schedule your annual preventive care visit! It's typically covered by insurance and helps identify potential health issues early. [x]  Lab & X-ray Appointments: Incomplete tests scheduled today, or call to schedule. X-rays: [facility] (M-F, 8:30am-noon or 1pm-5pm). [x]  Medical Information Release: Sign a release form at front desk to obtain relevant medical information we don't have.  Making the Most of Our Focused (20 minute) Appointments:  [x]   Clearly state your top concerns at the beginning of the visit to focus our discussion [x]   If you anticipate you will need more time, please inform the front desk during scheduling - we can book multiple appointments in the same week. [x]   If you have transportation problems- use our convenient video appointments or ask about transportation support. [x]   We can get down to business faster if you use MyChart to update information before the visit and submit non-urgent questions before your visit. Thank you for taking the time to provide details through MyChart.  Let our nurse know and she can import this information into your encounter documents.  Arrival and Wait Times: [x]   Arriving on time ensures that everyone receives prompt attention. [x]   Early morning (8a) and afternoon (1p) appointments tend to have shortest wait times. [x]   Unfortunately, we cannot  delay appointments for late arrivals or hold slots during phone calls.  Getting Answers and Following Up  [x]   Simple Questions & Concerns: For quick questions or basic follow-up after your visit, reach [place] at [phone] or MyChart messaging. [x]   Complex Concerns: If your concern is more complex, scheduling an appointment might be best. Discuss this with the staff to find the most suitable option. [x]   Lab & Imaging Results: We'll contact you directly if results are abnormal or you don't use MyChart. Most normal results will be on MyChart within 2-3 business days, with a review message from Dr. Jon Billings. Haven't heard back in 2 weeks? Need results sooner? Contact us at [phone]. [x]   Referrals: Our referral coordinator will manage specialist referrals. The specialist's office should contact you within 2 weeks to schedule an appointment. Call us if you haven't heard from them after 2 weeks.  Staying Connected  [x]   MyChart: Activate your MyChart for the fastest way to access results and message [place]. See the last page of this paperwork for instructions on how to activate.  Bring to Your Next Appointment  [x]   Medications: Please bring all your medication bottles to your next appointment to ensure we have an accurate record of your prescriptions. [x]   Health Diaries: If you're monitoring any health conditions at home, keeping a diary of your readings can be very helpful for discussions at your next appointment.  Billing  [x]   X-ray & Lab Orders: These are billed by separate companies. Contact the invoicing company directly for questions or concerns. [x]   Visit Charges: Discuss any billing inquiries with our administrative services team.  Your Satisfaction Matters  [x]   Share Your Experience: We strive for your satisfaction! If you have any complaints, or preferably compliments, please let Dr. Jon Billings know directly or contact our Practice Administrators, Edwena Felty or [employer], by asking at the front desk.   Reviewing Your Records  [x]   Review this early draft of your clinical encounter notes below and the final encounter summary tomorrow on MyChart after its been completed.   Crohn's disease of small intestine without complication (HCC) Assessment & Plan: No history bowel resection, doing well Defer to Dr. Elnoria Howard for continuation Humira   Major depressive disorder in partial remission, unspecified whether recurrent (HCC) Assessment & Plan: Recommend trial reduce trazodone, follow up 3 months if unable to consider ALT antidepressant Taking    Ocular herpes -     valACYclovir HCl; Take 1 tablet (500 mg total) by mouth every morning.  Dispense: 90 tablet; Refill: 3  Concentration deficit -     Ambulatory referral to Psychiatry

## 2022-09-11 DIAGNOSIS — R41840 Attention and concentration deficit: Secondary | ICD-10-CM | POA: Insufficient documentation

## 2022-09-11 DIAGNOSIS — Z0000 Encounter for general adult medical examination without abnormal findings: Secondary | ICD-10-CM | POA: Insufficient documentation

## 2022-09-11 DIAGNOSIS — B0230 Zoster ocular disease, unspecified: Secondary | ICD-10-CM | POA: Insufficient documentation

## 2022-09-11 NOTE — Assessment & Plan Note (Signed)
Insomnia: His insomnia is managed with Trazodone. We will continue Trazodone as needed for sleep and consider reducing it if his sleep remains stable.x

## 2022-09-11 NOTE — Assessment & Plan Note (Signed)
We will start Valtrex 500mg  daily for prophylaxis.

## 2022-09-11 NOTE — Assessment & Plan Note (Signed)
  Alcohol Use Disorder: He maintains sobriety with SMART meetings and reports no recent relapses. We will continue Acamprosate and Gabapentin as prescribed and encourage him to return promptly if any relapse occurs.

## 2022-09-11 NOTE — Assessment & Plan Note (Signed)
ADHD: With historical records unavailable, he agrees to see a specialist for confirmation of diagnosis. We referred him to [facility] for an ADHD evaluation and will consider initiation of ADHD medication upon confirmation of diagnosis.

## 2022-09-11 NOTE — Assessment & Plan Note (Signed)
General Health Maintenance / Followup Plans: We scheduled an annual wellness visit in 4 months. We encouraged him to maintain current lifestyle changes and continue SMART meetings. If the ADHD diagnosis is confirmed and treatment initiated by the specialist, a follow-up visit may be sooner.

## 2022-09-21 ENCOUNTER — Other Ambulatory Visit: Payer: Self-pay | Admitting: Internal Medicine

## 2022-09-21 DIAGNOSIS — F10282 Alcohol dependence with alcohol-induced sleep disorder: Secondary | ICD-10-CM

## 2022-11-12 ENCOUNTER — Encounter (INDEPENDENT_AMBULATORY_CARE_PROVIDER_SITE_OTHER): Payer: Self-pay

## 2022-11-17 ENCOUNTER — Other Ambulatory Visit: Payer: Self-pay | Admitting: Internal Medicine

## 2022-11-17 DIAGNOSIS — F10282 Alcohol dependence with alcohol-induced sleep disorder: Secondary | ICD-10-CM

## 2022-11-17 DIAGNOSIS — F331 Major depressive disorder, recurrent, moderate: Secondary | ICD-10-CM

## 2022-12-03 ENCOUNTER — Encounter: Payer: Self-pay | Admitting: Internal Medicine

## 2022-12-03 ENCOUNTER — Ambulatory Visit (INDEPENDENT_AMBULATORY_CARE_PROVIDER_SITE_OTHER): Payer: BC Managed Care – PPO | Admitting: Internal Medicine

## 2022-12-03 VITALS — BP 107/73 | Temp 97.4°F | Ht 69.0 in | Wt 166.0 lb

## 2022-12-03 DIAGNOSIS — E569 Vitamin deficiency, unspecified: Secondary | ICD-10-CM

## 2022-12-03 DIAGNOSIS — Z8249 Family history of ischemic heart disease and other diseases of the circulatory system: Secondary | ICD-10-CM

## 2022-12-03 DIAGNOSIS — Z8042 Family history of malignant neoplasm of prostate: Secondary | ICD-10-CM

## 2022-12-03 DIAGNOSIS — I781 Nevus, non-neoplastic: Secondary | ICD-10-CM

## 2022-12-03 DIAGNOSIS — Z0000 Encounter for general adult medical examination without abnormal findings: Secondary | ICD-10-CM

## 2022-12-03 DIAGNOSIS — Z0001 Encounter for general adult medical examination with abnormal findings: Secondary | ICD-10-CM | POA: Diagnosis not present

## 2022-12-03 DIAGNOSIS — Z119 Encounter for screening for infectious and parasitic diseases, unspecified: Secondary | ICD-10-CM

## 2022-12-03 LAB — CBC WITH DIFFERENTIAL/PLATELET
Basophils Absolute: 0.1 10*3/uL (ref 0.0–0.1)
Basophils Relative: 1.4 % (ref 0.0–3.0)
Eosinophils Absolute: 0.2 10*3/uL (ref 0.0–0.7)
Eosinophils Relative: 3.9 % (ref 0.0–5.0)
HCT: 45.3 % (ref 39.0–52.0)
Hemoglobin: 15.1 g/dL (ref 13.0–17.0)
Lymphocytes Relative: 36.3 % (ref 12.0–46.0)
Lymphs Abs: 2 10*3/uL (ref 0.7–4.0)
MCHC: 33.4 g/dL (ref 30.0–36.0)
MCV: 100.5 fl — ABNORMAL HIGH (ref 78.0–100.0)
Monocytes Absolute: 0.4 10*3/uL (ref 0.1–1.0)
Monocytes Relative: 7.7 % (ref 3.0–12.0)
Neutro Abs: 2.8 10*3/uL (ref 1.4–7.7)
Neutrophils Relative %: 50.7 % (ref 43.0–77.0)
Platelets: 298 10*3/uL (ref 150.0–400.0)
RBC: 4.5 Mil/uL (ref 4.22–5.81)
RDW: 13 % (ref 11.5–15.5)
WBC: 5.6 10*3/uL (ref 4.0–10.5)

## 2022-12-03 LAB — COMPREHENSIVE METABOLIC PANEL
ALT: 13 U/L (ref 0–53)
AST: 16 U/L (ref 0–37)
Albumin: 4.4 g/dL (ref 3.5–5.2)
Alkaline Phosphatase: 54 U/L (ref 39–117)
BUN: 9 mg/dL (ref 6–23)
CO2: 30 meq/L (ref 19–32)
Calcium: 9.7 mg/dL (ref 8.4–10.5)
Chloride: 105 meq/L (ref 96–112)
Creatinine, Ser: 1.24 mg/dL (ref 0.40–1.50)
GFR: 70.96 mL/min (ref >60.00)
Glucose, Bld: 93 mg/dL (ref 70–99)
Potassium: 4.8 meq/L (ref 3.5–5.1)
Sodium: 139 meq/L (ref 135–145)
Total Bilirubin: 0.3 mg/dL (ref 0.2–1.2)
Total Protein: 7.1 g/dL (ref 6.0–8.3)

## 2022-12-03 LAB — B12 AND FOLATE PANEL
Folate: 20.7 ng/mL (ref >5.9)
Vitamin B-12: 447 pg/mL (ref 211–911)

## 2022-12-03 LAB — LIPID PANEL
Cholesterol: 192 mg/dL (ref 0–200)
HDL: 60 mg/dL (ref >39.00)
LDL Cholesterol: 116 mg/dL — ABNORMAL HIGH (ref 0–99)
NonHDL: 131.67
Total CHOL/HDL Ratio: 3
Triglycerides: 79 mg/dL (ref 0.0–149.0)
VLDL: 15.8 mg/dL (ref 0.0–40.0)

## 2022-12-03 LAB — TSH: TSH: 2.52 u[IU]/mL (ref 0.35–5.50)

## 2022-12-03 LAB — PSA: PSA: 1.49 ng/mL (ref 0.10–4.00)

## 2022-12-03 NOTE — Progress Notes (Signed)
[facility]: [phone]   -- Annual Preventive Medical Office Visit --  Patient:  Brett Rubio      Age: 42 y.o.       Sex:  male  Date:   12/03/2022 Patient Care Team: Lula Olszewski, MD as PCP - General (Internal Medicine) Jeani Hawking, MD as Consulting Physician (Gastroenterology) Today's Healthcare Provider: Lula Olszewski, MD   Chief Complaint  Patient presents with   Annual Exam   Assessment & Plan Encounter for annual general medical examination with abnormal findings in adult  Today's Health Maintenance Counseling and Anticipatory Guidance:  Eye exams:  every 1-2 years recommended.  Having vision corrected can improve the quality of day-to-day life.   Dental health: Discussed importance of regular tooth brushing, flossing, and dental visits q6 months.  Poor dentition can lead to serious medical problems - particularly problems with heart valves.  Mr.Kimbler reports he has been keeping up with his dental visits.  He intends to do so in the future.  Sinus health: Encourage sterile saline nasal misting sinus rinses daily for pollen, to reduce allergies and risk for sinus infections.   Sterile can based misting products are recommended due to superior misting and ease of maintaining sterility. Sleep Apnea screening:  He  denies any significant problems with sleep quality or hypersomnolence or being advised that he has been told that he snores or has apnea STOP-Bang Score (scored NO unless checked) []  Do you snore loudly []  Tired, fatigued, or sleepy during the daytime []  Witness apneas []  Significant hypertension []  BMI greater than 35    []  Age older than 44 years old []  Has large neck size over 15.7 in [x]  Male Total Score:   Cardiovascular Risk Factor Reduction:   Advised patient of need for regular exercise and diet rich and fruits and vegetables and healthy fats to reduce risk of heart attack and stroke.  Avoid first- and second-hand smoke and  stimulants.   Avoid extreme exercise- exercise in moderation (150 minutes per week is a good goal) Wt Readings from Last 3 Encounters:  12/03/22 166 lb (75.3 kg)  09/10/22 162 lb (73.5 kg)  07/10/22 157 lb 3.2 oz (71.3 kg)  Body mass index is 24.51 kg/m. He reports his diet consists of not cardiovascular focused yet He reports his exercise includes of walks dog 2 miles nightly Health maintenance and immunizations reviewed and he was encouraged to complete anything that is due: Immunization History  Administered Date(s) Administered   Influenza-Unspecified 12/19/2021   PFIZER(Purple Top)SARS-COV-2 Vaccination 12/10/2019   Pfizer Covid-19 Vaccine Bivalent Booster 42yrs & up 12/07/2020   Tdap 10/20/2019   Unspecified SARS-COV-2 Vaccination 12/19/2021   There are no preventive care reminders to display for this patient.  Sexual transmitted infection screening: testing offered today, but patient declined as he feels he is low risk based on his sexual history.   Social History   Substance and Sexual Activity  Sexual Activity Yes   Substance use:  I discussed that my recommendation is total abstinence from all substances of abuse including smoke and 2nd hand smoke, alcohol, illicit drugs, smoking, inhalants, sugar.   Offered to assist with any use disorders or addictions.   Injury prevention: Discussed safety belts, safety helmets, smoke detectors. Cancer Screening: Penile/Testicle/Scrotum cancer screening: Asked the patient about genital warts or tumors/abnormalities of penis/testicles/scrotum, encouraged patient to to inform me of any. Patient reports there are none. Thyroid cancer screening: patient advised to check by palpating thyroid  for nodules Prostate cancer screening:  father had PSA  Lab Results  Component Value Date   PSA 2.37 12/29/2021   Colon cancer screening:    Denies strong family history of colon cancer or blood in stool so no screening is indicated until age 75.    Lung cancer screening:  Current guidelines recommend Individuals aged 42 to 60 who currently smoke or formerly smoked and have a ? 20 pack-year smoking history should undergo annual screening with low-dose computed tomography (LDCT). Skin cancer screening-  Advised regular sunscreen use. He denies worrisome, changing, or new skin lesions. Showed him pictures of melanomas for reference:  Return to care in 1 year for next preventative visit.     Preventative health care Medications Reconciled and  Reviewed He will continue his current medications, including Compro, Albuterol, Abilify, Lexapro, Neurontin, Humira, Multivitamins, Testosterone, Trazodone, and Valtrex.  Depression Screening We discussed the importance of mental health and stressed the need for honesty in reporting symptoms. There are no changes to his current treatment plan.  Cardiovascular Health He reports walking two miles nightly and acknowledges the need to improve fruit intake. We encouraged him to continue his exercise routine and to incorporate more fruits and healthy fats into his diet.  Liver Health We noted some enlargement of the liver, likely related to past alcohol use, and will continue monitoring his liver health.  Vitamin Deficiency A possible B12 deficiency was noted. We will order a B12 test with today's lab work.  Family History of Heart Disease Given his father's recent heart attack, we discussed heart health screening options, including a coronary CT scan, which we will order.  General Health Maintenance / Followup Plans We will order lab work, including PSA, blood counts, cholesterol, metabolic panel, and thyroid check. He is encouraged to continue regular eye and dental check-ups, daily sinus rinses, and regular self-checks for skin lesions, lumps/bumps, and thyroid nodules. Regular check-ups with a cardiologist are advised. A follow-up appointment in 3-6 months will be scheduled to review lab results, and  an annual preventive care visit for next year is recommended.  Family history of prostate cancer  FH: myocardial infarction  Hemangioma, capillary  Screening examination for infectious disease  Vitamin deficiency    Diagnoses and all orders for this visit: Family history of prostate cancer -     PSA FH: myocardial infarction -     Lipid panel -     TSH -     Comprehensive metabolic panel -     CBC with Differential/Platelet -     CT CARDIAC SCORING (SELF PAY ONLY); Future Hemangioma, capillary Encounter for annual general medical examination with abnormal findings in adult Preventative health care Screening examination for infectious disease Vitamin deficiency -     B12 and Folate Panel    Subjective  AI-Extracted: Discussed the use of AI scribe software for clinical note transcription with the patient, who gave verbal consent to proceed.  History of Present Illness   The patient, with a history of liver disease and vitamin deficiency, presented for an annual preventive care visit. The patient reported a regular exercise routine, walking approximately two miles nightly. However, the patient admitted to a less than optimal diet, consuming only one meal a day and lacking in fruit intake. The patient has a history of smoking but has since quit.  The patient has been managing a vitamin B deficiency and agreed to a B12 test during this visit. The patient also reported a family history  of prostate cancer and heart disease. The patient's father recently suffered a heart attack, prompting the patient to consider cardiovascular health more seriously.  The patient also reported a small skin lesion on the back, identified as a capillary hemangioma, likely related to the patient's history of liver disease.  The patient is currently on multiple medications, including Compro, albuterol, Abilify, Lexapro, Neurontin, Humira, multivitamins, testosterone, trazodone, and Valtrex. The patient  reported no issues with these medications and did not request any refills during this visit.  The patient denied any symptoms of sleep apnea, such as snoring or feeling drowsy in the mornings. The patient also denied any bleeding in stool, semen, or coughing up blood. The patient reported no lumps or bumps in the neck or underarms and denied any issues with testicles or penis.  The patient is up-to-date with vaccines and plans to continue with scheduled vaccinations.   Review of Systems  Constitutional:  Negative for chills, diaphoresis, fever, malaise/fatigue and weight loss.  HENT:  Negative for congestion, ear discharge, ear pain, hearing loss, nosebleeds, sinus pain, sore throat and tinnitus.   Eyes:  Negative for blurred vision, double vision, photophobia, pain, discharge and redness.  Respiratory:  Negative for cough, hemoptysis, sputum production, shortness of breath, wheezing and stridor.   Cardiovascular:  Negative for chest pain, palpitations, orthopnea, claudication, leg swelling and PND.  Gastrointestinal:  Negative for abdominal pain, blood in stool, constipation, diarrhea, heartburn, melena, nausea and vomiting.  Genitourinary:  Negative for dysuria, flank pain, frequency, hematuria and urgency.  Musculoskeletal:  Negative for back pain, falls, joint pain, myalgias and neck pain.  Skin:  Negative for itching and rash.  Neurological:  Negative for dizziness, tingling, tremors, sensory change, speech change, focal weakness, seizures, loss of consciousness, weakness and headaches.  Endo/Heme/Allergies:  Negative for environmental allergies and polydipsia. Does not bruise/bleed easily.  Psychiatric/Behavioral:  Negative for depression, hallucinations, memory loss, substance abuse and suicidal ideas. The patient is not nervous/anxious and does not have insomnia.     Patient's Request Regarding Billing and Review of Systems (ROS) Findings:  Patients are informed prior to the ROS that  any significant medical issues identified during the wellness visit may necessitate immediate attention, potentially resulting in a separate billable encounter beyond the scope of the preventive exam. This disclosure is required by both professional ethics and legal obligations, as healthcare providers must address any substantial health concerns raised during any patient interaction.  The current structure of annual preventive exams may inadvertently discourage patients from fully disclosing health concerns during the ROS due to potential financial implications. As such, patients often emphasize that any positive ROS findings are related to chronic conditions that they consider stable, requesting that these not be discussed during the preventive visit to avoid additional charges. Problem list overviews that were updated at today's visit: Problem  Fh: Myocardial Infarction   I attest that I have reviewed and confirmed the patients current medications to meet the medication reconciliation requirement  Current Outpatient Medications on File Prior to Visit  Medication Sig   acamprosate (CAMPRAL) 333 MG tablet Take 2 tablets (666 mg total) by mouth 3 (three) times daily with meals. As needed only, to relieve stress and reduce alcohol craving   albuterol (VENTOLIN HFA) 108 (90 Base) MCG/ACT inhaler SMARTSIG:1 Puff(s) Via Inhaler Every 4 Hours PRN   ARIPiprazole (ABILIFY) 2 MG tablet TAKE 1 TABLET(2 MG) BY MOUTH DAILY   escitalopram (LEXAPRO) 20 MG tablet Take 1 tablet (20 mg total) by mouth  daily.   gabapentin (NEURONTIN) 300 MG capsule TAKE 1 CAPSULE(300 MG) BY MOUTH THREE TIMES DAILY   HUMIRA, 2 PEN, 40 MG/0.4ML PNKT SMARTSIG:40 Milligram(s) SUB-Q Every 2 Weeks   HUMIRA-CD/UC/HS STARTER 80 MG/0.8ML PNKT Inject into the skin.   Multiple Vitamins-Minerals (MULTIVITAMIN ADULTS PO) Take 1 tablet by mouth daily.   testosterone enanthate (DELATESTRYL) 200 MG/ML injection Inject 200 mg into the muscle every  7 (seven) days. For IM use only   traZODone (DESYREL) 50 MG tablet TAKE 1 TABLET(50 MG) BY MOUTH AT BEDTIME AS NEEDED   valACYclovir (VALTREX) 500 MG tablet Take 1 tablet (500 mg total) by mouth every morning.   No current facility-administered medications on file prior to visit.  There are no discontinued medications. The following were reviewed and entered/updated into our electronic MEDICAL RECORD NUMBER   12/03/2022    8:07 AM  Depression screen PHQ 2/9  Decreased Interest 1  Down, Depressed, Hopeless 1  PHQ - 2 Score 2  Altered sleeping 1  Tired, decreased energy 1  Change in appetite 2  Feeling bad or failure about yourself  1  Trouble concentrating 1  Moving slowly or fidgety/restless 1  Suicidal thoughts 0  PHQ-9 Score 9  Difficult doing work/chores Not difficult at all  Patient insists that he does not want this addressed today. That he will return for management soon at separate appointment.   Past Medical History:  Diagnosis Date   Abnormal PSA 12/29/2021   Lab Results  Component  Value  Date     PSA  2.37  12/29/2021            Alcohol dependence (HCC) 11/26/2021   History of delirium tremens Did medical detox with librium/trazodone, at [facility]. Not a fan of 12 steps    GERD (gastroesophageal reflux disease) 11/10/2015   He takes omeprazole over-the-counter long-term.  Dr. 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 No pertinent surgical history. Family History  Problem Relation Age of Onset   Cancer Father    Alcohol abuse Brother    Allergies  Allergen Reactions   Sulfa Antibiotics Nausea And Vomiting    REACTION: nausea   Erythromycin     REACTION: nausea   Sulfonamide Derivatives     REACTION: nausea   Social History   Tobacco Use   Smoking status: Former    Types: Cigarettes   Smokeless tobacco: Never  Vaping Use   Vaping status: Never Used  Substance Use Topics   Alcohol use: Not Currently    Comment: Formerly-15+ glasses of wine, 30+ cans of beer   Drug use: Not Currently     Objective  BP 107/73   Temp (!) 97.4 F (36.3 C) (Temporal)   Ht 5\' 9"  (1.753 m)   Wt 166 lb (75.3 kg)   SpO2 97%  BMI 24.51 kg/m   Body mass index is 24.51 kg/m. Wt Readings from Last 3 Encounters:  12/03/22 166 lb (75.3 kg)  09/10/22 162 lb (73.5 kg)  07/10/22 157 lb 3.2 oz (71.3 kg)  Physical Exam   HEENT: Right eardrum exhibits scarring at the bottom. Left nostril wider than right, suggesting possible deviated septum. Teeth appear in good condition with some fillings in the upper right molar. Thyroid gland very normally sized with no nodules. ABDOMEN: Abdomen soft with no lumps or bumps. 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    Photographs Taken 12/03/2022 :     GEN: NAD, Resting Comfortably. HEENT: , Oropharynx clear, No Thyromegaly noted. No palpable lymphadenopathy or thyroid nodules. CARDIOVASCULAR: S1 and S2 heart sounds have regular rate and rhythm with no murmurs appreciated. PULMONARY:  Normal work of breathing. Clear to auscultation bilaterally with no crackles, wheezes, or rhonchi. ABDOMEN: Soft, Nontender, Nondistended.  MSK: No edema, cyanosis, or clubbing noted. SKIN: Warm, dry, no lesions of concern  observed.picture of hemangioma NEURO: CN2-12 grossly intact. Strength 5/5 in upper and lower extremities. Reflexes symmetric and intact bilaterally.  PSYCH: Normal affect and thought content, pleasant and cooperative.

## 2022-12-03 NOTE — Patient Instructions (Signed)
VISIT SUMMARY:  During your recent visit, we discussed your overall health, focusing on your liver health, vitamin deficiency, and cardiovascular health due to your family history. We also reviewed your current medications and you reported no issues with them. You have been managing a vitamin B deficiency and we agreed to conduct a B12 test. 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  YOUR PLAN:  -MEDICATION REVIEW: You will continue your current medications. There are no changes to your current treatment plan.  -DEPRESSION SCREENING: We discussed the importance of mental health. There are no changes to your current treatment plan.  -CARDIOVASCULAR HEALTH: You are doing well with your exercise routine. We encouraged you to continue this and to eat more fruits and healthy fats for better heart health.  -LIVER HEALTH: We noted some enlargement of your liver, likely due to past alcohol use. We will continue to monitor this.  -VITAMIN DEFICIENCY: We suspect a B12 deficiency and will conduct a B12 test to confirm this.  -FAMILY HISTORY OF HEART DISEASE: Given your father's recent heart attack, we discussed heart health screening options. We will order a coronary CT scan to check your heart health.  INSTRUCTIONS:  We will order lab work, including a PSA test, blood counts, cholesterol, metabolic panel, and thyroid check. Please continue regular eye and dental check-ups, daily sinus rinses, and regular self-checks for skin lesions, lumps/bumps, and thyroid nodules. Regular check-ups with a cardiologist are advised. A follow-up appointment in 3-6 months will be scheduled to review lab results, and an annual preventive care visit for next year is recommended.

## 2022-12-08 ENCOUNTER — Encounter: Payer: Self-pay | Admitting: Internal Medicine

## 2022-12-08 DIAGNOSIS — D7589 Other specified diseases of blood and blood-forming organs: Secondary | ICD-10-CM | POA: Insufficient documentation

## 2022-12-08 DIAGNOSIS — E785 Hyperlipidemia, unspecified: Secondary | ICD-10-CM | POA: Insufficient documentation

## 2022-12-08 DIAGNOSIS — N182 Chronic kidney disease, stage 2 (mild): Secondary | ICD-10-CM | POA: Insufficient documentation

## 2022-12-08 NOTE — Progress Notes (Signed)
If patient has viewed MyChart notification, close this task. Mail the results with this message otherwise:    Annual Health Exam Results Follow-Up: - LDL cholesterol elevated (161 mg/dL) - Mild macrocytosis noted - Mild decrease in kidney function (GFR 70.96)  Actions for patient: 1. Encourage Mediterranean diet and regular exercise 2. Reinforce importance of alcohol abstinence 3. Schedule video visit with Dr. Jon Billings in 4-12 weeks to discuss the kidney issues and other minor lab abnormalities.  Close task after confirming MyChart view or mailing.

## 2022-12-18 ENCOUNTER — Other Ambulatory Visit: Payer: Self-pay | Admitting: Internal Medicine

## 2022-12-18 DIAGNOSIS — F10282 Alcohol dependence with alcohol-induced sleep disorder: Secondary | ICD-10-CM

## 2022-12-28 ENCOUNTER — Ambulatory Visit
Admission: RE | Admit: 2022-12-28 | Discharge: 2022-12-28 | Disposition: A | Discharge status: Home / Self Care | Payer: BC Managed Care – PPO | Source: Ambulatory Visit | Attending: Internal Medicine

## 2022-12-28 DIAGNOSIS — Z8249 Family history of ischemic heart disease and other diseases of the circulatory system: Secondary | ICD-10-CM

## 2022-12-28 DIAGNOSIS — Z8482 Family history of sudden infant death syndrome: Secondary | ICD-10-CM | POA: Diagnosis not present

## 2023-01-28 ENCOUNTER — Other Ambulatory Visit: Payer: Self-pay | Admitting: Medical Genetics

## 2023-01-28 DIAGNOSIS — Z006 Encounter for examination for normal comparison and control in clinical research program: Secondary | ICD-10-CM

## 2023-02-01 ENCOUNTER — Other Ambulatory Visit (HOSPITAL_COMMUNITY)
Admission: RE | Admit: 2023-02-01 | Discharge: 2023-02-01 | Disposition: A | Discharge status: Home / Self Care | Payer: BC Managed Care – PPO | Source: Ambulatory Visit | Attending: Oncology | Admitting: Oncology

## 2023-02-01 DIAGNOSIS — Z006 Encounter for examination for normal comparison and control in clinical research program: Secondary | ICD-10-CM | POA: Insufficient documentation

## 2023-02-11 ENCOUNTER — Other Ambulatory Visit: Payer: Self-pay | Admitting: Internal Medicine

## 2023-02-11 DIAGNOSIS — F331 Major depressive disorder, recurrent, moderate: Secondary | ICD-10-CM

## 2023-02-11 DIAGNOSIS — F1020 Alcohol dependence, uncomplicated: Secondary | ICD-10-CM

## 2023-02-12 LAB — HELIX MOLECULAR SCREEN: Genetic Analysis Overall Interpretation: NEGATIVE

## 2023-02-12 LAB — GENECONNECT MOLECULAR SCREEN

## 2023-03-09 ENCOUNTER — Other Ambulatory Visit: Payer: Self-pay | Admitting: Internal Medicine

## 2023-03-09 DIAGNOSIS — F10282 Alcohol dependence with alcohol-induced sleep disorder: Secondary | ICD-10-CM

## 2023-04-02 ENCOUNTER — Other Ambulatory Visit: Payer: Self-pay | Admitting: Internal Medicine

## 2023-04-02 DIAGNOSIS — F10282 Alcohol dependence with alcohol-induced sleep disorder: Secondary | ICD-10-CM

## 2023-04-07 DIAGNOSIS — K5190 Ulcerative colitis, unspecified, without complications: Secondary | ICD-10-CM | POA: Diagnosis not present

## 2023-04-07 DIAGNOSIS — F1010 Alcohol abuse, uncomplicated: Secondary | ICD-10-CM | POA: Diagnosis not present

## 2023-04-07 DIAGNOSIS — R748 Abnormal levels of other serum enzymes: Secondary | ICD-10-CM | POA: Diagnosis not present

## 2023-04-14 DIAGNOSIS — K5190 Ulcerative colitis, unspecified, without complications: Secondary | ICD-10-CM | POA: Diagnosis not present

## 2023-06-01 ENCOUNTER — Other Ambulatory Visit: Payer: Self-pay | Admitting: Internal Medicine

## 2023-06-01 DIAGNOSIS — F331 Major depressive disorder, recurrent, moderate: Secondary | ICD-10-CM

## 2023-06-01 NOTE — Telephone Encounter (Signed)
 This is a duplicate request, sent the other request to Dr. Jon Billings already.

## 2023-06-02 ENCOUNTER — Ambulatory Visit: Payer: BC Managed Care – PPO | Admitting: Internal Medicine

## 2023-06-02 ENCOUNTER — Encounter: Payer: Self-pay | Admitting: Internal Medicine

## 2023-06-02 VITALS — BP 121/74 | HR 71 | Temp 98.7°F | Ht 69.0 in | Wt 175.6 lb

## 2023-06-02 DIAGNOSIS — E291 Testicular hypofunction: Secondary | ICD-10-CM | POA: Insufficient documentation

## 2023-06-02 DIAGNOSIS — N182 Chronic kidney disease, stage 2 (mild): Secondary | ICD-10-CM

## 2023-06-02 DIAGNOSIS — J4530 Mild persistent asthma, uncomplicated: Secondary | ICD-10-CM | POA: Diagnosis not present

## 2023-06-02 DIAGNOSIS — F1091 Alcohol use, unspecified, in remission: Secondary | ICD-10-CM

## 2023-06-02 DIAGNOSIS — F331 Major depressive disorder, recurrent, moderate: Secondary | ICD-10-CM

## 2023-06-02 DIAGNOSIS — F3181 Bipolar II disorder: Secondary | ICD-10-CM | POA: Diagnosis not present

## 2023-06-02 DIAGNOSIS — G4709 Other insomnia: Secondary | ICD-10-CM

## 2023-06-02 DIAGNOSIS — B0230 Zoster ocular disease, unspecified: Secondary | ICD-10-CM

## 2023-06-02 LAB — CBC WITH DIFFERENTIAL/PLATELET
Basophils Absolute: 0.1 10*3/uL (ref 0.0–0.1)
Basophils Relative: 1.7 % (ref 0.0–3.0)
Eosinophils Absolute: 0.1 10*3/uL (ref 0.0–0.7)
Eosinophils Relative: 2.3 % (ref 0.0–5.0)
HCT: 47.4 % (ref 39.0–52.0)
Hemoglobin: 15.8 g/dL (ref 13.0–17.0)
Lymphocytes Relative: 41.2 % (ref 12.0–46.0)
Lymphs Abs: 2.1 10*3/uL (ref 0.7–4.0)
MCHC: 33.3 g/dL (ref 30.0–36.0)
MCV: 97.1 fl (ref 78.0–100.0)
Monocytes Absolute: 0.5 10*3/uL (ref 0.1–1.0)
Monocytes Relative: 8.9 % (ref 3.0–12.0)
Neutro Abs: 2.4 10*3/uL (ref 1.4–7.7)
Neutrophils Relative %: 45.9 % (ref 43.0–77.0)
Platelets: 273 10*3/uL (ref 150.0–400.0)
RBC: 4.88 Mil/uL (ref 4.22–5.81)
RDW: 13.1 % (ref 11.5–15.5)
WBC: 5.1 10*3/uL (ref 4.0–10.5)

## 2023-06-02 LAB — URINALYSIS, ROUTINE W REFLEX MICROSCOPIC
Bilirubin Urine: NEGATIVE
Hgb urine dipstick: NEGATIVE
Ketones, ur: NEGATIVE
Leukocytes,Ua: NEGATIVE
Nitrite: NEGATIVE
RBC / HPF: NONE SEEN (ref 0–?)
Specific Gravity, Urine: 1.02 (ref 1.000–1.030)
Total Protein, Urine: NEGATIVE
Urine Glucose: NEGATIVE
Urobilinogen, UA: 0.2 (ref 0.0–1.0)
WBC, UA: NONE SEEN (ref 0–?)
pH: 7 (ref 5.0–8.0)

## 2023-06-02 LAB — URIC ACID: Uric Acid, Serum: 4.1 mg/dL (ref 4.0–7.8)

## 2023-06-02 LAB — VITAMIN D 25 HYDROXY (VIT D DEFICIENCY, FRACTURES): VITD: 49.83 ng/mL (ref 30.00–100.00)

## 2023-06-02 LAB — MAGNESIUM: Magnesium: 2 mg/dL (ref 1.5–2.5)

## 2023-06-02 MED ORDER — ALBUTEROL SULFATE HFA 108 (90 BASE) MCG/ACT IN AERS
1.0000 | INHALATION_SPRAY | RESPIRATORY_TRACT | 7 refills | Status: AC | PRN
Start: 2023-06-02 — End: ?

## 2023-06-02 MED ORDER — ESCITALOPRAM OXALATE 20 MG PO TABS: 20.0000 mg | ORAL_TABLET | Freq: Every day | ORAL | 3 refills | Status: DC

## 2023-06-02 MED ORDER — VALACYCLOVIR HCL 500 MG PO TABS: 500.0000 mg | ORAL_TABLET | Freq: Every morning | ORAL | 7 refills | Status: AC

## 2023-06-02 MED ORDER — GABAPENTIN 300 MG PO CAPS: 300.0000 mg | ORAL_CAPSULE | Freq: Three times a day (TID) | ORAL | 1 refills | Status: DC

## 2023-06-02 MED ORDER — ARIPIPRAZOLE 2 MG PO TABS: 2.0000 mg | ORAL_TABLET | Freq: Every day | ORAL | 2 refills | Status: DC

## 2023-06-02 MED ORDER — ACAMPROSATE CALCIUM 333 MG PO TBEC: 666.0000 mg | DELAYED_RELEASE_TABLET | Freq: Three times a day (TID) | ORAL | 3 refills | Status: DC

## 2023-06-02 MED ORDER — TRAZODONE HCL 50 MG PO TABS: 50.0000 mg | ORAL_TABLET | Freq: Every day | ORAL | 3 refills | Status: DC

## 2023-06-02 NOTE — Progress Notes (Signed)
 ==============================  [facility]: [phone]   -- Medical Office Visit --  Patient: Brett Rubio      Age: 43 y.o.       Sex:  male  Date:   06/02/2023 Today's Healthcare Provider: Lula Olszewski, MD  ==============================   CHIEF COMPLAINT: 6 month follow-up Alcohol use disorder in remission, interested in transitioning testosterone replacement therapy to this office.  SUBJECTIVE: Background This is a 43 y.o. male who has INSOMNIA; Crohn's disease (regional enteritis) (HCC); Alcohol use disorder in remission; History of ADHD; Family history of prostate cancer; Vitamin deficiency; MDD (major depressive disorder); GAD (generalized anxiety disorder); Ocular herpes; Concentration deficit; Healthcare maintenance; FH: myocardial infarction; Hyperlipidemia; Macrocytosis; Chronic kidney disease, stage 2 (mild); Bipolar 2 disorder (HCC); Mild persistent asthma without complication; and Hypogonadism in male on their problem list.  History of Present Illness Brett Rubio is a 43 year old male who presents for medication management and follow-up.  He is currently on testosterone supplementation prescribed through [facility] and monitors his blood pressure weekly, with stable readings around 114/74 mmHg. He is considering switching his testosterone prescription to his current provider due to cost concerns, as it currently costs him approximately $210 per month. He is aware that stopping testosterone would lead to withdrawal symptoms and is not concerned about potential fertility impact given his current family situation.  He is on multiple psychiatric medications including trazodone, Lexapro, and Abilify. Trazodone is used as needed for mood and sleep, Lexapro is taken regularly, and Abilify is used for bipolar disorder type II, which he self-identifies with. He is aware of potential side effects, including the risk of diabetes with Abilify.  Gabapentin 300 mg is taken three times daily for anxiety, which he finds effective without causing cognitive cloudiness.  He is taking Valtrex 500 mg daily for ocular herpes simplex virus (HSV) to prevent recurrence, as he has experienced significant vision loss in his left eye. He is under the care of an eye doctor who has suggested a cornea transplant as a potential future treatment option.  He has a history of alcohol use disorder, now resolved, and has been sober for nearly two years with only one slip. He continues to attend recovery meetings weekly and describes significant lifestyle improvements since quitting alcohol, including dietary changes to reduce fast food and junk food intake.  He reports a history of kidney disease and is undergoing regular monitoring. He mentions a past incident of elevated liver counts attributed to alcohol use, which has since resolved with sobriety.  He inquires about a possible hernia due to abdominal tenderness and a sensation of a 'bubble' in the abdominal area, which he describes as a break down the middle of the abdominals. No current asthma flares.  Visually reviewed that patient  has a past medical history of Abnormal PSA (12/29/2021), Alcohol dependence (HCC) (11/26/2021), GERD (gastroesophageal reflux disease) (11/10/2015), History of ADHD (12/29/2021), History of pseudomembranous enterocolitis (11/11/2015), Moderate alcohol use disorder (HCC) (11/26/2021), and Persistent depressive disorder with melancholic features, currently severe (12/29/2021).  Manually updated:  Problem  Bipolar 2 Disorder (Hcc)  Mild Persistent Asthma Without Complication  Hypogonadism in Male  Chronic Kidney Disease, Stage 2 (Mild)   #Chronic kidney disease, stage 2 (mild) (ICD-10: N18.2) Creatinine 1.24 mg/dL, calculated GFR 21.30 QM/VHQ/4.69G. Plan: Monitor renal function every 3-6 months. Optimize blood pressure control. Avoid nephrotoxic medications. Nephrologist: No  care team member to display  Medicines:  ARB/ACEI:  not yet discussed    SGLT2 inhibitor:   not yet discussed  Imaging:  Labwork:  Creatinine, Ser (mg/dL)  Date Value  81/19/1478 1.24  11/26/2021 0.97   GFR (mL/min)  Date Value  12/03/2022 70.96  11/26/2021 95.96   No results found for: "EGFR"  Lab Results  Component Value Date   NA 139 12/03/2022   K 4.8 12/03/2022   CL 105 12/03/2022   CO2 30 12/03/2022   CALCIUM 9.7 12/03/2022   PROT 7.1 12/03/2022   ALBUMIN 4.4 12/03/2022   HGB 15.1 12/03/2022   HCT 45.3 12/03/2022   PSA 1.49 12/03/2022   No results found for: "COLORURINE", "APPEARANCEUR", "LABSPEC", "PHURINE", "GLUCOSEU", "HGBUR", "BILIRUBINUR", "KETONESUR", "PROTEINUR", "UROBILINOGEN", "NITRITE", "LEUKOCYTESUR", "MICROALBUR"  Symptoms to Watch For: changes in urination, swelling, fatigue, chest pain, or shortness of breath  Medicines to Avoid:  diuretics (fluid pills), NSAIDs, proton pump inhibitor (PPI) stomach acid reducer, contrasted imaging studies, aminoglycosides, acyclovir, phosphate-based bowel prep, baclofen  Lifestyle and Nutrition:  Limit sodium, potassium, phosphorus and protein intake, consider nutrition consult, smoking avoidance, exercise regularly, manage weight, and reduce stress, control blood sugar, avoid dehydration (may need early emergency room visits for nausea vomiting diarrhea)  Vaccinations: flu/COVID/pneumonia/hepatitis B vaccinations are more important in kidney disease     Ocular Herpes   Herpes Simplex Virus (ocular): 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    Alcohol Use Disorder in Remission   September 11, 2022 interim history:   reporting he is remaining in recovery, recently did 7 days vacation without alcohol and enjoyed it with his family, still taking gabapentin/acamprosate as prescribed.  Prior history:  Patient is imperfect control but doing really well and participating in Smart  recovery programs History of delirium tremens [facility] Commitment to recovery is for his family Gabapentin/acamprosate helpful Traveling a lot for job ([employer]) can be tempting and burnout for family.    INSOMNIA   Still doing better on this and still using trazodone trying to reduce usage to as needed only Trazodone helps a lot Major source of triggering for alcohol use in the past     Verbally reviewed (per Abridge-generated extraction):  Past Medical History - History of high blood pressure - History of low testosterone - History of liver problems due to alcohol - History of alcohol use disorder - History of ocular HSV - History of insomnia - History of bipolar disorder - History of kidney disease Medications - Testosterone supplement - Valtrex - Trazodone - Lexapro - Abilify - Compro (2 tablets 3 times a day) - Gabapentin (300 mg 3 times a day) - Anastrozole - Humira - Multivitamins Social History - Patient quit alcohol, goes to recovery meetings Family History - Family history of asthma    Visually reviewed and manually updated: Current Outpatient Medications on File Prior to Visit  Medication Sig   ANASTROZOLE PO Take by mouth once a week. Takes every Friday.   HUMIRA, 2 PEN, 40 MG/0.4ML PNKT SMARTSIG:40 Milligram(s) SUB-Q Every 2 Weeks   Multiple Vitamins-Minerals (MULTIVITAMIN ADULTS PO) Take 1 tablet by mouth daily.   testosterone enanthate (DELATESTRYL) 200 MG/ML injection Inject 200 mg into the muscle every 7 (seven) days. For IM use only   HUMIRA-CD/UC/HS STARTER 80 MG/0.8ML PNKT Inject into the skin. (Patient not taking: Reported on 06/02/2023)   No current facility-administered medications on file prior to visit.   Medications  Discontinued During This Encounter  Medication Reason   albuterol (VENTOLIN HFA) 108 (90 Base) MCG/ACT inhaler Reorder   valACYclovir (VALTREX) 500 MG tablet Reorder    traZODone (DESYREL) 50 MG tablet Reorder   escitalopram (LEXAPRO) 20 MG tablet Reorder   gabapentin (NEURONTIN) 300 MG capsule Reorder   acamprosate (CAMPRAL) 333 MG tablet Reorder   ARIPiprazole (ABILIFY) 2 MG tablet Reorder      Objective      06/02/2023    8:16 AM 06/02/2023    8:06 AM 12/03/2022    8:10 AM  Vitals with BMI  Height  5\' 9"  5\' 9"   Weight  175 lbs 10 oz 166 lbs  BMI  25.92 24.5  Systolic 121 160 161  Diastolic 74 79 73  Pulse  71    Wt Readings from Last 10 Encounters:  06/02/23 175 lb 9.6 oz (79.7 kg)  12/03/22 166 lb (75.3 kg)  09/10/22 162 lb (73.5 kg)  07/10/22 157 lb 3.2 oz (71.3 kg)  05/04/22 160 lb (72.6 kg)  03/03/22 162 lb 3.2 oz (73.6 kg)  01/29/22 162 lb 12.8 oz (73.8 kg)  12/29/21 166 lb (75.3 kg)  11/26/21 161 lb (73 kg)   Vital signs reviewed.  Nursing notes reviewed. Weight trend reviewed. Physical Exam  Physical Exam General Appearance:  No acute distress appreciable.   Well-groomed, healthy-appearing male.  Well proportioned with no abnormal fat distribution.  Good muscle tone. Pulmonary:  Normal work of breathing at rest, no respiratory distress apparent. SpO2: 96 %  Musculoskeletal: All extremities are intact.  Neurological:  Awake, alert, oriented, and engaged.  No obvious focal neurological deficits or cognitive impairments.  Sensorium seems unclouded.   Speech is clear and coherent with logical content. Psychiatric:  Appropriate mood, pleasant and cooperative demeanor, thoughtful and engaged during the exam    Results for orders placed or performed in visit on 06/02/23  CBC with Differential/Platelet  Result Value Ref Range   WBC 5.1 4.0 - 10.5 K/uL   RBC 4.88 4.22 - 5.81 Mil/uL   Hemoglobin 15.8 13.0 - 17.0 g/dL   HCT 09.6 04.5 - 40.9 %   MCV 97.1 78.0 - 100.0 fl   MCHC 33.3 30.0 - 36.0 g/dL   RDW 81.1 91.4 - 78.2 %   Platelets 273.0 150.0 - 400.0 K/uL   Neutrophils Relative % 45.9 43.0 - 77.0 %   Lymphocytes Relative 41.2 12.0  - 46.0 %   Monocytes Relative 8.9 3.0 - 12.0 %   Eosinophils Relative 2.3 0.0 - 5.0 %   Basophils Relative 1.7 0.0 - 3.0 %   Neutro Abs 2.4 1.4 - 7.7 K/uL   Lymphs Abs 2.1 0.7 - 4.0 K/uL   Monocytes Absolute 0.5 0.1 - 1.0 K/uL   Eosinophils Absolute 0.1 0.0 - 0.7 K/uL   Basophils Absolute 0.1 0.0 - 0.1 K/uL  Vitamin D (25 hydroxy)  Result Value Ref Range   VITD 49.83 30.00 - 100.00 ng/mL  Magnesium  Result Value Ref Range   Magnesium 2.0 1.5 - 2.5 mg/dL  Uric acid  Result Value Ref Range   Uric Acid, Serum 4.1 4.0 - 7.8 mg/dL  Urinalysis, Routine w reflex microscopic  Result Value Ref Range   Color, Urine YELLOW Yellow;Lt. Yellow;Straw;Dark Yellow;Amber;Green;Red;Brown   APPearance CLEAR Clear;Turbid;Slightly Cloudy;Cloudy   Specific Gravity, Urine 1.020 1.000 - 1.030   pH 7.0 5.0 - 8.0   Total Protein, Urine NEGATIVE Negative   Urine Glucose NEGATIVE Negative   Ketones, ur  NEGATIVE Negative   Bilirubin Urine NEGATIVE Negative   Hgb urine dipstick NEGATIVE Negative   Urobilinogen, UA 0.2 0.0 - 1.0   Leukocytes,Ua NEGATIVE Negative   Nitrite NEGATIVE Negative   WBC, UA none seen 0-2/hpf   RBC / HPF none seen 0-2/hpf   Mucus, UA Presence of (A) None   Office Visit on 06/02/2023  Component Date Value   WBC 06/02/2023 5.1    RBC 06/02/2023 4.88    Hemoglobin 06/02/2023 15.8    HCT 06/02/2023 47.4    MCV 06/02/2023 97.1    MCHC 06/02/2023 33.3    RDW 06/02/2023 13.1    Platelets 06/02/2023 273.0    Neutrophils Relative % 06/02/2023 45.9    Lymphocytes Relative 06/02/2023 41.2    Monocytes Relative 06/02/2023 8.9    Eosinophils Relative 06/02/2023 2.3    Basophils Relative 06/02/2023 1.7    Neutro Abs 06/02/2023 2.4    Lymphs Abs 06/02/2023 2.1    Monocytes Absolute 06/02/2023 0.5    Eosinophils Absolute 06/02/2023 0.1    Basophils Absolute 06/02/2023 0.1    VITD 06/02/2023 49.83    Magnesium 06/02/2023 2.0    Uric Acid, Serum 06/02/2023 4.1    Color, Urine  06/02/2023 YELLOW    APPearance 06/02/2023 CLEAR    Specific Gravity, Urine 06/02/2023 1.020    pH 06/02/2023 7.0    Total Protein, Urine 06/02/2023 NEGATIVE    Urine Glucose 06/02/2023 NEGATIVE    Ketones, ur 06/02/2023 NEGATIVE    Bilirubin Urine 06/02/2023 NEGATIVE    Hgb urine dipstick 06/02/2023 NEGATIVE    Urobilinogen, UA 06/02/2023 0.2    Leukocytes,Ua 06/02/2023 NEGATIVE    Nitrite 06/02/2023 NEGATIVE    WBC, UA 06/02/2023 none seen    RBC / HPF 06/02/2023 none seen    Mucus, UA 06/02/2023 Presence of (A)   Hospital Outpatient Visit on 02/01/2023  Component Date Value   Genetic Analysis Overall* 02/01/2023 Negative    Genetic Disease Assessed 02/01/2023                     Value:[facility] Population Screen is a screening test that analyzes 11 genes related to hereditary breast and ovarian cancer (HBOC) syndrome, Lynch syndrome, and familial hypercholesterolemia. This test only reports clinically significant pathogenic and  likely pathogenic variants but does not report variants of uncertain significance (VUS). In addition, analysis of the PMS2 gene excludes exons 11-15, which overlap with a known pseudogene (PMS2CL).    Genetic Analysis Report 02/01/2023                     Value:No pathogenic or likely pathogenic variants were detected in the genes analyzed by this test.Genetic test results should be interpreted in the context of an individual's personal medical and family history. Alteration to medical management is NOT  recommended based solely on this result. Clinical correlation is advised.Additional Considerations- This is a screening test; individuals may still carry pathogenic or likely pathogenic variant(s) in the tested genes that are not detected by this test.-  For individuals at risk for these or other related conditions based on factors including personal or family history, diagnostic testing is recommended.- The absence of pathogenic or likely pathogenic  variant(s) in the analyzed genes, while reassuring,  does not eliminate the possibility of a hereditary condition; there are other variants and genes associated with heart disease and hereditary cancer that are not included in this test.    Genes Tested 02/01/2023 See  Notes    Disclaimer 02/01/2023 See Notes    Sequencing Location 02/01/2023 See Notes    Interpretation Methods a* 02/01/2023 See Notes   Office Visit on 12/03/2022  Component Date Value   Cholesterol 12/03/2022 192    Triglycerides 12/03/2022 79.0    HDL 12/03/2022 60.00    VLDL 12/03/2022 15.8    LDL Cholesterol 12/03/2022 116 (H)    Total CHOL/HDL Ratio 12/03/2022 3    NonHDL 12/03/2022 131.67    TSH 12/03/2022 2.52    Sodium 12/03/2022 139    Potassium 12/03/2022 4.8    Chloride 12/03/2022 105    CO2 12/03/2022 30    Glucose, Bld 12/03/2022 93    BUN 12/03/2022 9    Creatinine, Ser 12/03/2022 1.24    Total Bilirubin 12/03/2022 0.3    Alkaline Phosphatase 12/03/2022 54    AST 12/03/2022 16    ALT 12/03/2022 13    Total Protein 12/03/2022 7.1    Albumin 12/03/2022 4.4    GFR 12/03/2022 70.96    Calcium 12/03/2022 9.7    WBC 12/03/2022 5.6    RBC 12/03/2022 4.50    Hemoglobin 12/03/2022 15.1    HCT 12/03/2022 45.3    MCV 12/03/2022 100.5 (H)    MCHC 12/03/2022 33.4    RDW 12/03/2022 13.0    Platelets 12/03/2022 298.0    Neutrophils Relative % 12/03/2022 50.7    Lymphocytes Relative 12/03/2022 36.3    Monocytes Relative 12/03/2022 7.7    Eosinophils Relative 12/03/2022 3.9    Basophils Relative 12/03/2022 1.4    Neutro Abs 12/03/2022 2.8    Lymphs Abs 12/03/2022 2.0    Monocytes Absolute 12/03/2022 0.4    Eosinophils Absolute 12/03/2022 0.2    Basophils Absolute 12/03/2022 0.1    PSA 12/03/2022 1.49    Vitamin B-12 12/03/2022 447    Folate 12/03/2022 20.7   No image results found. No results found.     Assessment & Plan Alcohol use disorder in remission Sober for nearly two years with one minor  slip. Continues to attend weekly recovery meetings and reports significant lifestyle improvements. Ongoing support is important to prevent relapse. Continue attending weekly recovery meetings. On gabapentin and acamprosate for anxiety management. Reports gabapentin helps without causing cloudiness and prefers to continue current regimen. Refill gabapentin 300 mg TID and Compro 2 tablets TID. 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 Ophthalmologist suggested cornea transplant as a potential future intervention. Continuous suppressive therapy is important to prevent recurrence. Refill Valtrex 500 mg daily for two years. Moderate episode of recurrent major depressive disorder (HCC)  Mild persistent asthma without complication  Bipolar 2 disorder (HCC) Self-reports diagnosis and is on Abilify and Lexapro. Discussed potential reduction of Abilify due to side effects, but prefers to continue due to risk of manic episodes. Informed of potential side effects of Abilify, including diabetes risk. Refill Abilify and Lexapro. Consider gradual reduction of Abilify if stable. Chronic kidney disease, stage 2 (mild) Will order lab testing to guide management.  Hypogonadism in male Currently on testosterone replacement therapy with significant improvement in sleep and energy levels. Considering switching to in-office management due to cost concerns. Regular monitoring of testosterone, LH, FSH every 3-6 months, and PSA checks is necessary. Potential side effects include infertility and liver issues. Aware of cost implications and need for comprehensive monitoring. Obtain previous lab work from [employer] and schedule a  follow-up visit for potential switch to in-office management.  Blood pressure is slightly elevated on initial check but generally well-controlled at home without antihypertensive medication.  Monitors blood pressure weekly due to testosterone therapy. Continue home blood pressure monitoring. Other insomnia Uses trazodone as needed for sleep. Discussed potential for reducing dependence on trazodone but prefers to have it available as needed. Refill trazodone and consider reducing use if stable. Made significant lifestyle changes including quitting alcohol and improving diet. Regular monitoring and a balanced diet are important. Continues to attend recovery meetings and has significantly reduced fast food and junk food intake. Continue balanced diet and regular monitoring of liver function and metabolic panel. Schedule annual follow-up and follow-up for testosterone management in 1-6 months. Order blood count, metabolic panel, liver function tests, and urinalysis.      Orders Placed During this Encounter:   Orders Placed This Encounter  Procedures   CBC with Differential/Platelet   Comprehensive metabolic panel    Standing Status:   Future    Expiration Date:   06/01/2024   Vitamin D (25 hydroxy)   Magnesium   Uric acid   Urinalysis, Routine w reflex microscopic   Meds ordered this encounter  Medications   valACYclovir (VALTREX) 500 MG tablet    Sig: Take 1 tablet (500 mg total) by mouth every morning.    Dispense:  90 tablet    Refill:  7   acamprosate (CAMPRAL) 333 MG tablet    Sig: Take 2 tablets (666 mg total) by mouth 3 (three) times daily.    Dispense:  540 tablet    Refill:  3   gabapentin (NEURONTIN) 300 MG capsule    Sig: Take 1 capsule (300 mg total) by mouth 3 (three) times daily.    Dispense:  270 capsule    Refill:  1   traZODone (DESYREL) 50 MG tablet    Sig: Take 1 tablet (50 mg total) by mouth at bedtime.    Dispense:  90 tablet    Refill:  3   escitalopram (LEXAPRO) 20 MG tablet    Sig: Take 1 tablet (20 mg total) by mouth daily.    Dispense:  90 tablet    Refill:  3   ARIPiprazole (ABILIFY) 2 MG tablet    Sig: Take 1 tablet (2 mg total) by mouth  daily.    Dispense:  30 tablet    Refill:  2   albuterol (VENTOLIN HFA) 108 (90 Base) MCG/ACT inhaler    Sig: Inhale 1 puff into the lungs every 4 (four) hours as needed for wheezing or shortness of breath.    Dispense:  3 each    Refill:  7       **This document was synthesized by artificial intelligence (Abridge) using HIPAA-compliant recording of the clinical interaction;   We discussed the use of AI scribe software for clinical note transcription with the patient, who gave verbal consent to proceed.    Additional Info: This encounter employed state-of-the-art, real-time, collaborative documentation. The patient actively reviewed and assisted in updating their electronic medical record on a shared screen, ensuring transparency and facilitating joint problem-solving for the problem list, overview, and plan. This approach promotes accurate, informed care. The treatment plan was discussed and reviewed in detail, including medication safety, potential side effects, and all patient questions. We confirmed understanding and comfort with the plan. Follow-up instructions were established, including contacting the office for any concerns, returning if symptoms worsen, persist, or new symptoms develop, and precautions  for potential emergency department visits.

## 2023-06-02 NOTE — Assessment & Plan Note (Signed)
 Will order lab testing to guide management.

## 2023-06-02 NOTE — Assessment & Plan Note (Signed)
 Sober for nearly two years with one minor slip. Continues to attend weekly recovery meetings and reports significant lifestyle improvements. Ongoing support is important to prevent relapse. Continue attending weekly recovery meetings. On gabapentin and acamprosate for anxiety management. Reports gabapentin helps without causing cloudiness and prefers to continue current regimen. Refill gabapentin 300 mg TID and Compro 2 tablets TID.

## 2023-06-02 NOTE — Assessment & Plan Note (Signed)
 Self-reports diagnosis and is on Abilify and Lexapro. Discussed potential reduction of Abilify due to side effects, but prefers to continue due to risk of manic episodes. Informed of potential side effects of Abilify, including diabetes risk. Refill Abilify and Lexapro. Consider gradual reduction of Abilify if stable.

## 2023-06-02 NOTE — Assessment & Plan Note (Signed)
 Uses trazodone as needed for sleep. Discussed potential for reducing dependence on trazodone but prefers to have it available as needed. Refill trazodone and consider reducing use if stable.

## 2023-06-02 NOTE — Assessment & Plan Note (Signed)
 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 Ophthalmologist suggested cornea transplant as a potential future intervention. Continuous suppressive therapy is important to prevent recurrence. Refill Valtrex 500 mg daily for two years.

## 2023-06-02 NOTE — Assessment & Plan Note (Signed)
 Currently on testosterone replacement therapy with significant improvement in sleep and energy levels. Considering switching to in-office management due to cost concerns. Regular monitoring of testosterone, LH, FSH every 3-6 months, and PSA checks is necessary. Potential side effects include infertility and liver issues. Aware of cost implications and need for comprehensive monitoring. Obtain previous lab work from [employer] and schedule a follow-up visit for potential switch to in-office management.  Blood pressure is slightly elevated on initial check but generally well-controlled at home without antihypertensive medication. Monitors blood pressure weekly due to testosterone therapy. Continue home blood pressure monitoring.

## 2023-06-02 NOTE — Patient Instructions (Signed)
 VISIT SUMMARY:  Today, we discussed your current medications and overall health. You are managing your testosterone therapy, psychiatric medications, and anxiety well. We also reviewed your history of alcohol use disorder, which is in remission, and your ongoing management of ocular herpes simplex virus. Additionally, we addressed your concerns about a possible hernia and your general health maintenance.  YOUR PLAN:  -TESTOSTERONE REPLACEMENT THERAPY: Testosterone replacement therapy helps improve sleep and energy levels. We discussed switching your prescription to our office due to cost concerns. Regular monitoring of testosterone levels and other related tests every 3-6 months is necessary. We will obtain your previous lab work and schedule a follow-up visit to discuss this further.  -HYPERTENSION: Your blood pressure is generally well-controlled at home. Continue to monitor your blood pressure weekly, especially since you are on testosterone therapy.  -BIPOLAR DISORDER TYPE II: Bipolar Disorder Type II involves mood swings that include depressive and hypomanic episodes. You are currently on Abilify and Lexapro. We discussed the potential side effects of Abilify, including the risk of diabetes, but you prefer to continue the medication to avoid manic episodes. We will refill your prescriptions and consider a gradual reduction of Abilify if your condition remains stable.  -GENERALIZED ANXIETY DISORDER: Generalized Anxiety Disorder involves excessive worry about various aspects of life. You are taking gabapentin and Compro, which you find effective. We will continue with your current regimen and refill your medications.  -INSOMNIA: Insomnia is difficulty falling or staying asleep. You use trazodone as needed for sleep. We discussed reducing your dependence on trazodone, but you prefer to have it available as needed. We will refill your prescription and consider reducing its use if your condition  remains stable.  -HERPES SIMPLEX VIRUS (HSV) KERATITIS: HSV Keratitis is an eye infection caused by the herpes simplex virus, leading to vision loss. You are on Valtrex 500 mg daily to prevent recurrence. Continuous suppressive therapy is important. We will refill your prescription for the next two years.  -ALCOHOL USE DISORDER IN REMISSION: Alcohol Use Disorder is a pattern of alcohol use that involves problems controlling drinking. You have been sober for nearly two years with one minor slip. You continue to attend weekly recovery meetings and report significant lifestyle improvements. Ongoing support is important to prevent relapse.  -GENERAL HEALTH MAINTENANCE: You have made significant lifestyle changes, including quitting alcohol and improving your diet. Regular monitoring and a balanced diet are important. Continue with your balanced diet and regular monitoring of liver function and metabolic panel.  INSTRUCTIONS:  Please schedule an annual follow-up and a follow-up for testosterone management in 1-6 months. We will also need to order a blood count, metabolic panel, liver function tests, and urinalysis.  For more information, you can read your full clinical note, available in your patient portal.  It was a pleasure seeing you today! Your health and satisfaction are our top priorities.  Glenetta Hew, MD  Your Providers PCP: Lula Olszewski, MD,  [phone]) Referring Provider: Lula Olszewski, MD,  [phone]) Care Team Provider: Jeani Hawking, MD,  [phone])     NEXT STEPS: [x]  Early Intervention: Schedule sooner appointment, call our on-call services, or go to emergency room if there is any significant Increase in pain or discomfort New or worsening symptoms Sudden or severe changes in your health [x]  Flexible Follow-Up: We recommend a No follow-ups on file. for optimal routine care. This allows for progress monitoring and treatment adjustments. [x]  Preventive  Care: Schedule your annual preventive care visit! It's typically covered  by insurance and helps identify potential health issues early. [x]  Lab & X-ray Appointments: Incomplete tests scheduled today, or call to schedule. X-rays: [facility] (M-F, 8:30am-noon or 1pm-5pm). [x]  Medical Information Release: Sign a release form at front desk to obtain relevant medical information we don't have.  MAKING THE MOST OF OUR FOCUSED 20 MINUTE APPOINTMENTS: [x]   Clearly state your top concerns at the beginning of the visit to focus our discussion [x]   If you anticipate you will need more time, please inform the front desk during scheduling - we can book multiple appointments in the same week. [x]   If you have transportation problems- use our convenient video appointments or ask about transportation support. [x]   We can get down to business faster if you use MyChart to update information before the visit and submit non-urgent questions before your visit. Thank you for taking the time to provide details through MyChart.  Let our nurse know and she can import this information into your encounter documents.  Arrival and Wait Times: [x]   Arriving on time ensures that everyone receives prompt attention. [x]   Early morning (8a) and afternoon (1p) appointments tend to have shortest wait times. [x]   Unfortunately, we cannot delay appointments for late arrivals or hold slots during phone calls.  Getting Answers and Following Up [x]   Simple Questions & Concerns: For quick questions or basic follow-up after your visit, reach [place] at [phone] or MyChart messaging. [x]   Complex Concerns: If your concern is more complex, scheduling an appointment might be best. Discuss this with the staff to find the most suitable option. [x]   Lab & Imaging Results: We'll contact you directly if results are abnormal or you don't use MyChart. Most normal results will be on MyChart within 2-3 business days, with a review  message from Dr. Jon Billings. Haven't heard back in 2 weeks? Need results sooner? Contact us at [phone]. [x]   Referrals: Our referral coordinator will manage specialist referrals. The specialist's office should contact you within 2 weeks to schedule an appointment. Call us if you haven't heard from them after 2 weeks.  Staying Connected [x]   MyChart: Activate your MyChart for the fastest way to access results and message [place]. See the last page of this paperwork for instructions on how to activate.  Bring to Your Next Appointment [x]   Medications: Please bring all your medication bottles to your next appointment to ensure we have an accurate record of your prescriptions. [x]   Health Diaries: If you're monitoring any health conditions at home, keeping a diary of your readings can be very helpful for discussions at your next appointment.  Billing [x]   X-ray & Lab Orders: These are billed by separate companies. Contact the invoicing company directly for questions or concerns. [x]   Visit Charges: Discuss any billing inquiries with our administrative services team.  Your Satisfaction Matters [x]   Share Your Experience: We strive for your satisfaction! If you have any complaints, or preferably compliments, please let Dr. Jon Billings know directly or contact our Practice Administrators, Edwena Felty or [employer], by asking at the front desk.   Reviewing Your Records [x]   Review this early draft of your clinical encounter notes below and the final encounter summary tomorrow on MyChart after its been completed.  All orders placed so far are visible here: Alcohol use disorder in remission Assessment & Plan: Sober for nearly two years with one minor slip. Continues to attend weekly recovery meetings and reports significant lifestyle improvements. Ongoing support  is important to prevent relapse. Continue attending weekly recovery meetings. On gabapentin and acamprosate for anxiety management. Reports  gabapentin helps without causing cloudiness and prefers to continue current regimen. Refill gabapentin 300 mg TID and Compro 2 tablets TID.  Orders: -     Acamprosate Calcium; Take 2 tablets (666 mg total) by mouth 3 (three) times daily.  Dispense: 540 tablet; Refill: 3 -     Gabapentin; Take 1 capsule (300 mg total) by mouth 3 (three) times daily.  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 Ophthalmologist suggested cornea transplant as a potential future intervention. Continuous suppressive therapy is important to prevent recurrence. Refill Valtrex 500 mg daily for two years.  Orders: -     valACYclovir HCl; Take 1 tablet (500 mg total) by mouth every morning.  Dispense: 90 tablet; Refill: 7  Moderate episode of recurrent major depressive disorder (HCC) -     traZODone HCl; Take 1 tablet (50 mg total) by mouth at bedtime.  Dispense: 90 tablet; Refill: 3 -     Escitalopram Oxalate; Take 1 tablet (20 mg total) by mouth daily.  Dispense: 90 tablet; Refill: 3  Mild persistent asthma without complication -     Albuterol Sulfate HFA; Inhale 1 puff into the lungs every 4 (four) hours as needed for wheezing or shortness of breath.  Dispense: 3 each; Refill: 7  Bipolar 2 disorder (HCC) Assessment & Plan: Self-reports diagnosis and is on Abilify and Lexapro. Discussed potential reduction of Abilify due to side effects, but prefers to continue due to risk of manic episodes. Informed of potential side effects of Abilify, including diabetes risk. Refill Abilify and Lexapro. Consider gradual reduction of Abilify if stable.  Orders: -     Gabapentin; Take 1 capsule (300 mg total) by mouth 3 (three) times daily.  Dispense: 270 capsule; Refill: 1 -     ARIPiprazole; Take 1 tablet (2 mg total) by mouth daily.  Dispense: 30 tablet; Refill: 2 -      CBC with Differential/Platelet  Chronic kidney disease, stage 2 (mild) Assessment & Plan: Will order lab testing to guide management.   Orders: -     CBC with Differential/Platelet -     Comprehensive metabolic panel; Future -     VITAMIN D 25 Hydroxy (Vit-D Deficiency, Fractures) -     Magnesium -     Uric acid -     Urinalysis, Routine w reflex microscopic  Hypogonadism in male Assessment & Plan: Currently on testosterone replacement therapy with significant improvement in sleep and energy levels. Considering switching to in-office management due to cost concerns. Regular monitoring of testosterone, LH, FSH every 3-6 months, and PSA checks is necessary. Potential side effects include infertility and liver issues. Aware of cost implications and need for comprehensive monitoring. Obtain previous lab work from [facility] and schedule a follow-up visit for potential switch to in-office management.  Blood pressure is slightly elevated on initial check but generally well-controlled at home without antihypertensive medication. Monitors blood pressure weekly due to testosterone therapy. Continue home blood pressure monitoring.  Other insomnia Assessment & Plan: Uses trazodone as needed for sleep. Discussed potential for reducing dependence on trazodone but prefers to have it available as needed. Refill trazodone and consider reducing use if stable.

## 2023-07-06 ENCOUNTER — Ambulatory Visit: Admitting: Internal Medicine

## 2023-07-06 ENCOUNTER — Encounter: Payer: Self-pay | Admitting: Internal Medicine

## 2023-07-06 VITALS — BP 107/60 | HR 83 | Temp 97.6°F | Ht 69.0 in | Wt 175.0 lb

## 2023-07-06 DIAGNOSIS — F1091 Alcohol use, unspecified, in remission: Secondary | ICD-10-CM | POA: Diagnosis not present

## 2023-07-06 DIAGNOSIS — F3181 Bipolar II disorder: Secondary | ICD-10-CM

## 2023-07-06 DIAGNOSIS — N182 Chronic kidney disease, stage 2 (mild): Secondary | ICD-10-CM | POA: Diagnosis not present

## 2023-07-06 DIAGNOSIS — D7589 Other specified diseases of blood and blood-forming organs: Secondary | ICD-10-CM

## 2023-07-06 DIAGNOSIS — E7841 Elevated Lipoprotein(a): Secondary | ICD-10-CM | POA: Diagnosis not present

## 2023-07-06 DIAGNOSIS — E291 Testicular hypofunction: Secondary | ICD-10-CM

## 2023-07-06 DIAGNOSIS — G4709 Other insomnia: Secondary | ICD-10-CM

## 2023-07-06 DIAGNOSIS — E569 Vitamin deficiency, unspecified: Secondary | ICD-10-CM

## 2023-07-06 LAB — COMPREHENSIVE METABOLIC PANEL WITH GFR
ALT: 21 U/L (ref 0–53)
AST: 23 U/L (ref 0–37)
Albumin: 5 g/dL (ref 3.5–5.2)
Alkaline Phosphatase: 51 U/L (ref 39–117)
BUN: 8 mg/dL (ref 6–23)
CO2: 28 meq/L (ref 19–32)
Calcium: 10 mg/dL (ref 8.4–10.5)
Chloride: 104 meq/L (ref 96–112)
Creatinine, Ser: 1.3 mg/dL (ref 0.40–1.50)
GFR: 66.77 mL/min (ref >60.00)
Glucose, Bld: 105 mg/dL — ABNORMAL HIGH (ref 70–99)
Potassium: 4.7 meq/L (ref 3.5–5.1)
Sodium: 138 meq/L (ref 135–145)
Total Bilirubin: 0.5 mg/dL (ref 0.2–1.2)
Total Protein: 7.4 g/dL (ref 6.0–8.3)

## 2023-07-06 LAB — MICROALBUMIN / CREATININE URINE RATIO
Creatinine,U: 193.2 mg/dL
Microalb Creat Ratio: 4.3 mg/g (ref 0.0–30.0)
Microalb, Ur: 0.8 mg/dL (ref 0.0–1.9)

## 2023-07-06 LAB — LIPID PANEL
Cholesterol: 210 mg/dL — ABNORMAL HIGH (ref 0–200)
HDL: 50.1 mg/dL (ref >39.00)
LDL Cholesterol: 134 mg/dL — ABNORMAL HIGH (ref 0–99)
NonHDL: 159.84
Total CHOL/HDL Ratio: 4
Triglycerides: 129 mg/dL (ref 0.0–149.0)
VLDL: 25.8 mg/dL (ref 0.0–40.0)

## 2023-07-06 LAB — VITAMIN D 25 HYDROXY (VIT D DEFICIENCY, FRACTURES): VITD: 59.25 ng/mL (ref 30.00–100.00)

## 2023-07-06 LAB — B12 AND FOLATE PANEL
Folate: >25.2 ng/mL (ref >5.9)
Vitamin B-12: 622 pg/mL (ref 211–911)

## 2023-07-06 NOTE — Assessment & Plan Note (Signed)
 Per outside documents he brought from [facility] -Testosterone levels are slightly elevated, with a total testosterone of 938 and a free testosterone of 22, both above high normal. PSA levels have decreased, which is positive. He is considering switching testosterone therapy management to this provider due to cost concerns. There is caution about prescribing testosterone due to his prostate cancer history and associated risks, including potential prostate cancer development. A contract, drug screens, and occasional bottle uploads are required to ensure compliance and prevent diversion. The provider is willing to prescribe testosterone but emphasizes the risks, including potential prostate cancer and the need for DEA compliance. Discuss testosterone therapy management options. Provide a contract for testosterone therapy management, including drug screens and bottle uploads. Schedule a follow-up visit next month to finalize the decision on testosterone therapy management.  I gave him a contract for consideration and explained my regulatory diversion prevention program will upload images.

## 2023-07-06 NOTE — Progress Notes (Signed)
 ==============================  [facility]: [phone]   -- Medical Office Visit --  Patient: Brett Rubio      Age: 43 y.o.       Sex:  male  Date:   07/06/2023 Today's Healthcare Provider: Lula Olszewski, MD  ==============================   Chief Complaint: alcohol use disorder in remission (Pt states he is doing well.)  History of Present Illness 43 year old male who presents for a review of testosterone therapy and related lab results. He requests evaluation for possibly transferring testosterone cypionate injection(s) to me for management, as [facility] cost 210$ monthly  He is undergoing testosterone therapy and recent lab results show slightly elevated testosterone levels, with a total testosterone of 938 and a free testosterone of 22. He is considering changing the management of his therapy. No confirmatory low testosterone levels are seen in the labwork.  He has a history of alcohol use disorder and has been sober for two years. He experiences occasional intrusive thoughts about drinking, especially during stressful situations, but has not had cravings for alcohol in a long time. He finds attending SMART meetings supportive and beneficial. He takes Compro (prochlorperazine) regularly, two tablets three times a day.  He has mild kidney disease with a GFR of 70.9, noted in labs from 2023. He recalls being asked about dehydration during a recent urinalysis. He also has slightly high cholesterol and macrocytosis. He takes a multivitamin and a B complex vitamin, and was previously on folic acid by prescription.  His Crohn's disease is mostly silent, effectively managed with Humira. He has a history of vitamin D deficiency and takes a multivitamin.  He experiences insomnia, sleeping about five and a half hours per night, but maintains a consistent sleep schedule.  He has not pursued ADHD treatment despite receiving a referral.  He engages in  gardening as a hobby, which he has been doing for fifteen years. He practices no-till gardening, layering compost and wood chips to build soil, and finds it enjoyable and beneficial, especially with rising food prices.  Background: Reviewed: He has INSOMNIA; Crohn's disease (regional enteritis) (HCC); Alcohol use disorder in remission; History of ADHD; Family history of prostate cancer; Vitamin deficiency; MDD (major depressive disorder); GAD (generalized anxiety disorder); Ocular herpes; Concentration deficit; Healthcare maintenance; FH: myocardial infarction; Hyperlipidemia; Macrocytosis; Chronic kidney disease, stage 2 (mild); Bipolar 2 disorder (HCC); Mild persistent asthma without complication; and Hypogonadism in male on their problem list.  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  Manually updated: No problems updated.  Reviewed:  Allergies as of 07/06/2023 - Review Complete 07/06/2023  Allergen Reaction Noted   Sulfa antibiotics Nausea And Vomiting 11/09/2020   Erythromycin     Sulfonamide derivatives      Medications: Reviewed: Current Outpatient Medications on File Prior to Visit  Medication Sig   acamprosate (CAMPRAL) 333 MG tablet Take 2 tablets (666 mg total) by mouth 3 (three) times daily.   albuterol (VENTOLIN HFA) 108 (90 Base) MCG/ACT inhaler Inhale 1 puff into the lungs every 4 (four) hours as needed for wheezing or shortness of breath.   ANASTROZOLE PO Take by mouth once a week. Takes every Friday.   ARIPiprazole (ABILIFY) 2 MG tablet Take 1 tablet (2 mg total)  by mouth daily.   escitalopram (LEXAPRO) 20 MG tablet Take 1  tablet (20 mg total) by mouth daily.   gabapentin (NEURONTIN) 300 MG capsule Take 1 capsule (300 mg total) by mouth 3 (three) times daily.   HUMIRA, 2 PEN, 40 MG/0.4ML PNKT SMARTSIG:40 Milligram(s) SUB-Q Every 2 Weeks   HUMIRA-CD/UC/HS STARTER 80 MG/0.8ML PNKT Inject into the skin.   Multiple Vitamins-Minerals (MULTIVITAMIN ADULTS PO) Take 1 tablet by mouth daily.   testosterone enanthate (DELATESTRYL) 200 MG/ML injection Inject 200 mg into the muscle every 7 (seven) days. For IM use only   traZODone (DESYREL) 50 MG tablet Take 1 tablet (50 mg total) by mouth at bedtime.   valACYclovir (VALTREX) 500 MG tablet Take 1 tablet (500 mg total) by mouth every morning.   No current facility-administered medications on file prior to visit.  There are no discontinued medications.      Physical Exam:    07/06/2023    8:35 AM 06/02/2023    8:16 AM 06/02/2023    8:06 AM  Vitals with BMI  Height 5\' 9"   5\' 9"   Weight 175 lbs  175 lbs 10 oz  BMI 25.83  25.92  Systolic 107 121 161  Diastolic 60 74 79  Pulse 83  71   Wt Readings from Last 10 Encounters:  07/06/23 175 lb (79.4 kg)  06/02/23 175 lb 9.6 oz (79.7 kg)  12/03/22 166 lb (75.3 kg)  09/10/22 162 lb (73.5 kg)  07/10/22 157 lb 3.2 oz (71.3 kg)  05/04/22 160 lb (72.6 kg)  03/03/22 162 lb 3.2 oz (73.6 kg)  01/29/22 162 lb 12.8 oz (73.8 kg)  12/29/21 166 lb (75.3 kg)  11/26/21 161 lb (73 kg)  Vital signs reviewed.  Nursing notes reviewed. Weight trend reviewed. Physical Exam  Physical Exam General Appearance: Patient is well-developed, well-nourished, and in no acute distress. Gait and posture appear normal. Neurological: Patient is awake, alert, and oriented to person, place, and time. No focal neurological deficits detected on screening exam. Coordination, muscle strength, and sensation are intact. Psychiatric/Mental Status: Patient's mood is appropriate with a pleasant, calm demeanor. Speech is clear, coherent, and goal-directed. No  observable signs of acute psychosis, mania, or significant anxiety. Substance Misuse Indicators: Pupils are equal, round. No track marks, skin lesions, or other visible stigmata of substance misuse. Behavior is cooperative and consistent with stable ADHD management, with no signs suggestive of intoxication or withdrawal.  General Appearance:  No acute distress appreciable.   Well-groomed, healthy-appearing male.  Well proportioned with no abnormal fat distribution.  Good muscle tone. Pulmonary:  Normal work of breathing at rest, no respiratory distress apparent. SpO2: 98 %  Musculoskeletal: All extremities are intact.  Neurological:  Awake, alert, oriented, and engaged.  No obvious focal neurological deficits or cognitive impairments.  Sensorium seems unclouded.   Speech is clear and coherent with logical content. Psychiatric:  Appropriate mood, pleasant and cooperative demeanor, thoughtful and engaged during the exam    Results for orders placed or performed in visit on 07/06/23  B12 and Folate Panel  Result Value Ref Range   Vitamin B-12 622 211 - 911 pg/mL   Folate >25.2 >5.9 ng/mL  Comp Met (CMET)  Result Value Ref Range   Sodium 138 135 - 145 mEq/L   Potassium 4.7 3.5 - 5.1 mEq/L   Chloride 104 96 - 112 mEq/L   CO2 28 19 - 32 mEq/L   Glucose, Bld 105 (H) 70 - 99 mg/dL   BUN  8 6 - 23 mg/dL   Creatinine, Ser 8.11 0.40 - 1.50 mg/dL   Total Bilirubin 0.5 0.2 - 1.2 mg/dL   Alkaline Phosphatase 51 39 - 117 U/L   AST 23 0 - 37 U/L   ALT 21 0 - 53 U/L   Total Protein 7.4 6.0 - 8.3 g/dL   Albumin 5.0 3.5 - 5.2 g/dL   GFR 91.47 >82.95 mL/min   Calcium 10.0 8.4 - 10.5 mg/dL  Microalbumin / creatinine urine ratio  Result Value Ref Range   Microalb, Ur 0.8 0.0 - 1.9 mg/dL   Creatinine,U 621.3 mg/dL   Microalb Creat Ratio 4.3 0.0 - 30.0 mg/g  Vitamin D (25 hydroxy)  Result Value Ref Range   VITD 59.25 30.00 - 100.00 ng/mL  Lipid panel  Result Value Ref Range   Cholesterol 210 (H) 0  - 200 mg/dL   Triglycerides 086.5 0.0 - 149.0 mg/dL   HDL 78.46 >96.29 mg/dL   VLDL 52.8 0.0 - 41.3 mg/dL   LDL Cholesterol 244 (H) 0 - 99 mg/dL   Total CHOL/HDL Ratio 4    NonHDL 159.84    Office Visit on 07/06/2023  Component Date Value   Vitamin B-12 07/06/2023 622    Folate 07/06/2023 >25.2    Sodium 07/06/2023 138    Potassium 07/06/2023 4.7    Chloride 07/06/2023 104    CO2 07/06/2023 28    Glucose, Bld 07/06/2023 105 (H)    BUN 07/06/2023 8    Creatinine, Ser 07/06/2023 1.30    Total Bilirubin 07/06/2023 0.5    Alkaline Phosphatase 07/06/2023 51    AST 07/06/2023 23    ALT 07/06/2023 21    Total Protein 07/06/2023 7.4    Albumin 07/06/2023 5.0    GFR 07/06/2023 66.77    Calcium 07/06/2023 10.0    Microalb, Ur 07/06/2023 0.8    Creatinine,U 07/06/2023 193.2    Microalb Creat Ratio 07/06/2023 4.3    VITD 07/06/2023 59.25    Cholesterol 07/06/2023 210 (H)    Triglycerides 07/06/2023 129.0    HDL 07/06/2023 50.10    VLDL 07/06/2023 25.8    LDL Cholesterol 07/06/2023 134 (H)    Total CHOL/HDL Ratio 07/06/2023 4    NonHDL 07/06/2023 159.84   Office Visit on 06/02/2023  Component Date Value   WBC 06/02/2023 5.1    RBC 06/02/2023 4.88    Hemoglobin 06/02/2023 15.8    HCT 06/02/2023 47.4    MCV 06/02/2023 97.1    MCHC 06/02/2023 33.3    RDW 06/02/2023 13.1    Platelets 06/02/2023 273.0    Neutrophils Relative % 06/02/2023 45.9    Lymphocytes Relative 06/02/2023 41.2    Monocytes Relative 06/02/2023 8.9    Eosinophils Relative 06/02/2023 2.3    Basophils Relative 06/02/2023 1.7    Neutro Abs 06/02/2023 2.4    Lymphs Abs 06/02/2023 2.1    Monocytes Absolute 06/02/2023 0.5    Eosinophils Absolute 06/02/2023 0.1    Basophils Absolute 06/02/2023 0.1    VITD 06/02/2023 49.83    Magnesium 06/02/2023 2.0    Uric Acid, Serum 06/02/2023 4.1    Color, Urine 06/02/2023 YELLOW    APPearance 06/02/2023 CLEAR    Specific Gravity, Urine 06/02/2023 1.020    pH 06/02/2023  7.0    Total Protein, Urine 06/02/2023 NEGATIVE    Urine Glucose 06/02/2023 NEGATIVE    Ketones, ur 06/02/2023 NEGATIVE    Bilirubin Urine 06/02/2023 NEGATIVE    Hgb urine dipstick 06/02/2023 NEGATIVE  Urobilinogen, UA 06/02/2023 0.2    Leukocytes,Ua 06/02/2023 NEGATIVE    Nitrite 06/02/2023 NEGATIVE    WBC, UA 06/02/2023 none seen    RBC / HPF 06/02/2023 none seen    Mucus, UA 06/02/2023 Presence of (A)   Hospital Outpatient Visit on 02/01/2023  Component Date Value   Genetic Analysis Overall* 02/01/2023 Negative    Genetic Disease Assessed 02/01/2023                     Value:[facility] Population Screen is a screening test that analyzes 11 genes related to hereditary breast and ovarian cancer (HBOC) syndrome, Lynch syndrome, and familial hypercholesterolemia. This test only reports clinically significant pathogenic and  likely pathogenic variants but does not report variants of uncertain significance (VUS). In addition, analysis of the PMS2 gene excludes exons 11-15, which overlap with a known pseudogene (PMS2CL).    Genetic Analysis Report 02/01/2023                     Value:No pathogenic or likely pathogenic variants were detected in the genes analyzed by this test.Genetic test results should be interpreted in the context of an individual's personal medical and family history. Alteration to medical management is NOT  recommended based solely on this result. Clinical correlation is advised.Additional Considerations- This is a screening test; individuals may still carry pathogenic or likely pathogenic variant(s) in the tested genes that are not detected by this test.-  For individuals at risk for these or other related conditions based on factors including personal or family history, diagnostic testing is recommended.- The absence of pathogenic or likely pathogenic variant(s) in the analyzed genes, while reassuring,  does not eliminate the possibility of a hereditary condition; there  are other variants and genes associated with heart disease and hereditary cancer that are not included in this test.    Genes Tested 02/01/2023 See Notes    Disclaimer 02/01/2023 See Notes    Sequencing Location 02/01/2023 See Notes    Interpretation Methods a* 02/01/2023 See Notes   Office Visit on 12/03/2022  Component Date Value   Cholesterol 12/03/2022 192    Triglycerides 12/03/2022 79.0    HDL 12/03/2022 60.00    VLDL 12/03/2022 15.8    LDL Cholesterol 12/03/2022 116 (H)    Total CHOL/HDL Ratio 12/03/2022 3    NonHDL 12/03/2022 131.67    TSH 12/03/2022 2.52    Sodium 12/03/2022 139    Potassium 12/03/2022 4.8    Chloride 12/03/2022 105    CO2 12/03/2022 30    Glucose, Bld 12/03/2022 93    BUN 12/03/2022 9    Creatinine, Ser 12/03/2022 1.24    Total Bilirubin 12/03/2022 0.3    Alkaline Phosphatase 12/03/2022 54    AST 12/03/2022 16    ALT 12/03/2022 13    Total Protein 12/03/2022 7.1    Albumin 12/03/2022 4.4    GFR 12/03/2022 70.96    Calcium 12/03/2022 9.7    WBC 12/03/2022 5.6    RBC 12/03/2022 4.50    Hemoglobin 12/03/2022 15.1    HCT 12/03/2022 45.3    MCV 12/03/2022 100.5 (H)    MCHC 12/03/2022 33.4    RDW 12/03/2022 13.0    Platelets 12/03/2022 298.0    Neutrophils Relative % 12/03/2022 50.7    Lymphocytes Relative 12/03/2022 36.3    Monocytes Relative 12/03/2022 7.7    Eosinophils Relative 12/03/2022 3.9    Basophils Relative 12/03/2022 1.4    Neutro Abs 12/03/2022  2.8    Lymphs Abs 12/03/2022 2.0    Monocytes Absolute 12/03/2022 0.4    Eosinophils Absolute 12/03/2022 0.2    Basophils Absolute 12/03/2022 0.1    PSA 12/03/2022 1.49    Vitamin B-12 12/03/2022 447    Folate 12/03/2022 20.7   No image results found. No results found.    Results LABS Total Testosterone: 938 (03/2023) Free Testosterone: 22 (03/2023) PSA: 1.8 (03/2023) GFR: 70.9 (03/2023) MCV: High (03/2023)     Assessment & Plan Alcohol use disorder in remission In remission  from alcohol use disorder for two years and actively participating in [facility] meetings for support. Reports rare cravings and intrusive thoughts but has not relapsed. Acknowledged progress and the importance of continued support. Continue attending SMART meetings for ongoing support. Chronic kidney disease, stage 2 (mild) Mild chronic kidney disease with a GFR of 70.9, below the normal range. Suspected kidney damage may have occurred due to severe dehydration or alcohol use in the past. Plan to recheck kidney function to confirm the diagnosis and assess any ongoing damage. Order blood work to recheck kidney function, including GFR. Perform a urine test to check for any ongoing kidney damage. Hypogonadism in male Per outside documents he brought from [facility] -Testosterone levels are slightly elevated, with a total testosterone of 938 and a free testosterone of 22, both above high normal. PSA levels have decreased, which is positive. He is considering switching testosterone therapy management to this provider due to cost concerns. There is caution about prescribing testosterone due to his prostate cancer history and associated risks, including potential prostate cancer development. A contract, drug screens, and occasional bottle uploads are required to ensure compliance and prevent diversion. The provider is willing to prescribe testosterone but emphasizes the risks, including potential prostate cancer and the need for DEA compliance. Discuss testosterone therapy management options. Provide a contract for testosterone therapy management, including drug screens and bottle uploads. Schedule a follow-up visit next month to finalize the decision on testosterone therapy management.  I gave him a contract for consideration and explained my regulatory diversion prevention program will upload images. Elevated lipoprotein(a) Slightly elevated cholesterol levels. Plan to recheck cholesterol levels to monitor the  condition and assess the need for any interventions. Order blood work to recheck cholesterol levels. Macrocytosis MCV is elevated, indicating macrocytosis, often due to vitamin deficiencies such as B12 or folate deficiency, or thyroid problems. Currently taking a multivitamin and B complex, but further investigation is planned to rule out other causes. Order blood work to check vitamin B12, folate, and thyroid function. Consider a peripheral blood smear to rule out leukemia, if insurance allows. Vitamin deficiency Vitamin D deficiency and currently only taking a multivitamin. Plan to check vitamin D levels, especially given the kidney disease, and may consider vitamin D supplementation. Order blood work to check vitamin D levels and parathyroid hormone. Consider vitamin D supplementation based on blood work results. Bipolar 2 disorder (HCC) Mood is well-managed, with anxiety being more of a struggle than depression. Acknowledged current mental health status and the importance of monitoring mood changes. Monitor mood and anxiety levels. Other insomnia Reports sleeping about five and a half hours per night and is on a decent sleep schedule. Acknowledged current sleep pattern and the importance of maintaining a regular sleep schedule. Continue current sleep schedule and monitor for any changes in sleep patterns.        Orders Placed During this Encounter:   Orders Placed This Encounter  Procedures  B12 and Folate Panel   Methylmalonic Acid   TSH Rfx on Abnormal to Free T4   Comp Met (CMET)   Microalbumin / creatinine urine ratio    [place]   Lipid panel    Standing Status:   Future    Number of Occurrences:   1    Expiration Date:   07/05/2024   Vitamin D (25 hydroxy)   PTH, intact (no Ca)   Peripheral Blood Smear Review    Standing Status:   Future    Number of Occurrences:   1    Expiration Date:   07/05/2024   CBC with Differential/Platelet    Standing Status:   Future    Number of  Occurrences:   1    Expiration Date:   07/05/2024          **This document was synthesized by artificial intelligence ([employer]bridge) using HIPAA-compliant recording of the clinical interaction;   We discussed the use of AI scribe software for clinical note transcription with the patient, who gave verbal consent to proceed.    Additional Info: This encounter employed state-of-the-art, real-time, collaborative documentation. The patient actively reviewed and assisted in updating their electronic medical record on a shared screen, ensuring transparency and facilitating joint problem-solving for the problem list, overview, and plan. This approach promotes accurate, informed care. The treatment plan was discussed and reviewed in detail, including medication safety, potential side effects, and all patient questions. We confirmed understanding and comfort with the plan. Follow-up instructions were established, including contacting the office for any concerns, returning if symptoms worsen, persist, or new symptoms develop, and precautions for potential emergency department visits.

## 2023-07-06 NOTE — Assessment & Plan Note (Signed)
 Slightly elevated cholesterol levels. Plan to recheck cholesterol levels to monitor the condition and assess the need for any interventions. Order blood work to recheck cholesterol levels.

## 2023-07-06 NOTE — Assessment & Plan Note (Signed)
 Mild chronic kidney disease with a GFR of 70.9, below the normal range. Suspected kidney damage may have occurred due to severe dehydration or alcohol use in the past. Plan to recheck kidney function to confirm the diagnosis and assess any ongoing damage. Order blood work to recheck kidney function, including GFR. Perform a urine test to check for any ongoing kidney damage.

## 2023-07-06 NOTE — Assessment & Plan Note (Signed)
 MCV is elevated, indicating macrocytosis, often due to vitamin deficiencies such as B12 or folate deficiency, or thyroid problems. Currently taking a multivitamin and B complex, but further investigation is planned to rule out other causes. Order blood work to check vitamin B12, folate, and thyroid function. Consider a peripheral blood smear to rule out leukemia, if insurance allows.

## 2023-07-06 NOTE — Assessment & Plan Note (Signed)
 In remission from alcohol use disorder for two years and actively participating in [facility] meetings for support. Reports rare cravings and intrusive thoughts but has not relapsed. Acknowledged progress and the importance of continued support. Continue attending SMART meetings for ongoing support.

## 2023-07-06 NOTE — Assessment & Plan Note (Signed)
 Vitamin D deficiency and currently only taking a multivitamin. Plan to check vitamin D levels, especially given the kidney disease, and may consider vitamin D supplementation. Order blood work to check vitamin D levels and parathyroid hormone. Consider vitamin D supplementation based on blood work results.

## 2023-07-06 NOTE — Assessment & Plan Note (Signed)
 Mood is well-managed, with anxiety being more of a struggle than depression. Acknowledged current mental health status and the importance of monitoring mood changes. Monitor mood and anxiety levels.

## 2023-07-06 NOTE — Patient Instructions (Signed)
 It was a pleasure seeing you today! Your health and satisfaction are our top priorities.  Glenetta Hew, MD     NEXT STEPS: [x]  Early Intervention: Schedule sooner appointment, call our on-call services, or go to emergency room if there is any significant Increase in pain or discomfort New or worsening symptoms Sudden or severe changes in your health [x]  Flexible Follow-Up: We recommend a No follow-ups on file. for optimal routine care. This allows for progress monitoring and treatment adjustments. [x]  Preventive Care: Schedule your annual preventive care visit! It's typically covered by insurance and helps identify potential health issues early. [x]  Lab & X-ray Appointments: Incomplete tests scheduled today, or call to schedule. X-rays: [facility] (M-F, 8:30am-noon or 1pm-5pm). [x]  Medical Information Release: Sign a release form at front desk to obtain relevant medical information we don't have.  MAKING THE MOST OF OUR FOCUSED 20 MINUTE APPOINTMENTS: [x]   Clearly state your top concerns at the beginning of the visit to focus our discussion [x]   If you anticipate you will need more time, please inform the front desk during scheduling - we can book multiple appointments in the same week. [x]   If you have transportation problems- use our convenient video appointments or ask about transportation support. [x]   We can get down to business faster if you use MyChart to update information before the visit and submit non-urgent questions before your visit. Thank you for taking the time to provide details through MyChart.  Let our nurse know and she can import this information into your encounter documents.  Arrival and Wait Times: [x]   Arriving on time ensures that everyone receives prompt attention. [x]   Early morning (8a) and afternoon (1p) appointments tend to have shortest wait times. [x]   Unfortunately, we cannot delay appointments for late arrivals or hold slots during phone  calls.  Getting Answers and Following Up [x]   Simple Questions & Concerns: For quick questions or basic follow-up after your visit, reach [place] at [phone] or MyChart messaging. [x]   Complex Concerns: If your concern is more complex, scheduling an appointment might be best. Discuss this with the staff to find the most suitable option. [x]   Lab & Imaging Results: We'll contact you directly if results are abnormal or you don't use MyChart. Most normal results will be on MyChart within 2-3 business days, with a review message from Dr. Jon Billings. Haven't heard back in 2 weeks? Need results sooner? Contact us at [phone]. [x]   Referrals: Our referral coordinator will manage specialist referrals. The specialist's office should contact you within 2 weeks to schedule an appointment. Call us if you haven't heard from them after 2 weeks.  Staying Connected [x]   MyChart: Activate your MyChart for the fastest way to access results and message [place]. See the last page of this paperwork for instructions on how to activate.  Bring to Your Next Appointment [x]   Medications: Please bring all your medication bottles to your next appointment to ensure we have an accurate record of your prescriptions. [x]   Health Diaries: If you're monitoring any health conditions at home, keeping a diary of your readings can be very helpful for discussions at your next appointment.  Billing [x]   X-ray & Lab Orders: These are billed by separate companies. Contact the invoicing company directly for questions or concerns. [x]   Visit Charges: Discuss any billing inquiries with our administrative services team.  Your Satisfaction Matters [x]   Share Your Experience: We strive for your satisfaction! If you have  any complaints, or preferably compliments, please let Dr. Jon Billings know directly or contact our Practice Administrators, Edwena Felty or [employer], by asking at the front desk.   Reviewing Your Records [x]   Review  this early draft of your clinical encounter notes below and the final encounter summary tomorrow on MyChart after its been completed.  All orders placed so far are visible here: Alcohol use disorder in remission  Chronic kidney disease, stage 2 (mild) -     Comprehensive metabolic panel with GFR -     Microalbumin / creatinine urine ratio -     VITAMIN D 25 Hydroxy (Vit-D Deficiency, Fractures) -     Parathyroid hormone, intact (no Ca)  Hypogonadism in male  Elevated lipoprotein(a) -     Lipid panel; Future  Macrocytosis -     B12 and Folate Panel -     Methylmalonic acid, serum -     TSH Rfx on Abnormal to Free T4 -     Peripheral Blood Smear Review; Future -     CBC with Differential/Platelet; Future  Vitamin deficiency  Bipolar 2 disorder (HCC)  Other insomnia

## 2023-07-06 NOTE — Assessment & Plan Note (Signed)
 Reports sleeping about five and a half hours per night and is on a decent sleep schedule. Acknowledged current sleep pattern and the importance of maintaining a regular sleep schedule. Continue current sleep schedule and monitor for any changes in sleep patterns.

## 2023-07-07 LAB — CBC WITH DIFFERENTIAL/PLATELET
Basophils Absolute: 0.1 10*3/uL (ref 0.0–0.1)
Basophils Relative: 2.2 % (ref 0.0–3.0)
Eosinophils Absolute: 0.1 10*3/uL (ref 0.0–0.7)
Eosinophils Relative: 2.4 % (ref 0.0–5.0)
HCT: 46.8 % (ref 39.0–52.0)
Hemoglobin: 16 g/dL (ref 13.0–17.0)
Lymphocytes Relative: 42.3 % (ref 12.0–46.0)
Lymphs Abs: 2 10*3/uL (ref 0.7–4.0)
MCHC: 34.1 g/dL (ref 30.0–36.0)
MCV: 97.2 fl (ref 78.0–100.0)
Monocytes Absolute: 0.4 10*3/uL (ref 0.1–1.0)
Monocytes Relative: 9.5 % (ref 3.0–12.0)
Neutro Abs: 2.1 10*3/uL (ref 1.4–7.7)
Neutrophils Relative %: 43.6 % (ref 43.0–77.0)
Platelets: 263 10*3/uL (ref 150.0–400.0)
RBC: 4.81 Mil/uL (ref 4.22–5.81)
RDW: 13.6 % (ref 11.5–15.5)
WBC: 4.7 10*3/uL (ref 4.0–10.5)

## 2023-07-07 LAB — TSH RFX ON ABNORMAL TO FREE T4: TSH: 2.45 u[IU]/mL (ref 0.450–4.500)

## 2023-07-08 ENCOUNTER — Encounter: Payer: Self-pay | Admitting: Internal Medicine

## 2023-07-08 LAB — METHYLMALONIC ACID, SERUM: Methylmalonic Acid, Quant: 94 nmol/L (ref 55–335)

## 2023-07-08 LAB — PARATHYROID HORMONE, INTACT (NO CA): PTH: 21 pg/mL (ref 16–77)

## 2023-07-13 LAB — PERIPHERAL BLOOD SMEAR REVIEW

## 2023-07-13 LAB — EXTRA LAV TOP TUBE

## 2023-08-10 ENCOUNTER — Ambulatory Visit: Payer: Self-pay | Admitting: Internal Medicine

## 2023-08-10 NOTE — Progress Notes (Signed)
 If patient wants to go forward with this still:     Next step: Confirm true low-testosterone before we consider taking over therapy.      Schedule a brief nurse-only visit or lab-only appointment in 4-6 weeks (patient must stay off all testosterone until then).      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

## 2023-08-11 ENCOUNTER — Other Ambulatory Visit: Payer: Self-pay

## 2023-08-11 DIAGNOSIS — R7989 Other specified abnormal findings of blood chemistry: Secondary | ICD-10-CM

## 2023-08-11 DIAGNOSIS — R5383 Other fatigue: Secondary | ICD-10-CM

## 2023-08-29 IMAGING — CT CT ABD-PELV W/ CM
1 of 3 series · 13 of 32 positions shown, 19 images · IV contrast (APPLIED)
Comparison: None.

CLINICAL DATA: Abdominal pain, ulcerative colitis, Crohn's disease

EXAM:
CT ABDOMEN AND PELVIS WITH CONTRAST
TECHNIQUE: Multidetector CT imaging of the abdomen and pelvis was performed
using the standard protocol following bolus administration of
intravenous contrast.
CONTRAST:  100mL [id number] IOPAMIDOL ([id number]) INJECTION 61%,
additional oral enteric contrast

[Series 2: abd/pelvis w/cm · axial · 0.71mm/px · z∈[-477,-57]mm · 13 of 98 slices shown, 19 images]
[im 7/98  soft-tissue]
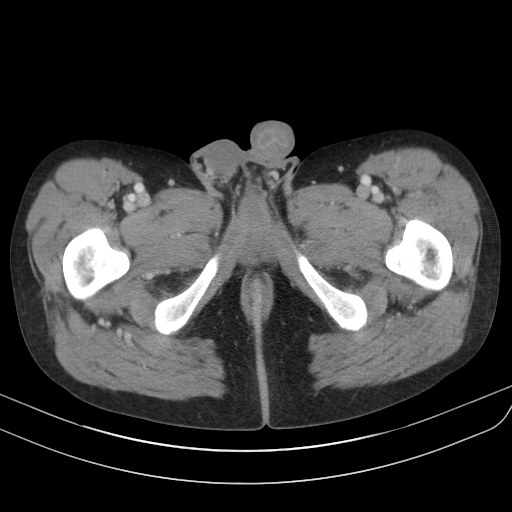
[im 7/98  bone]
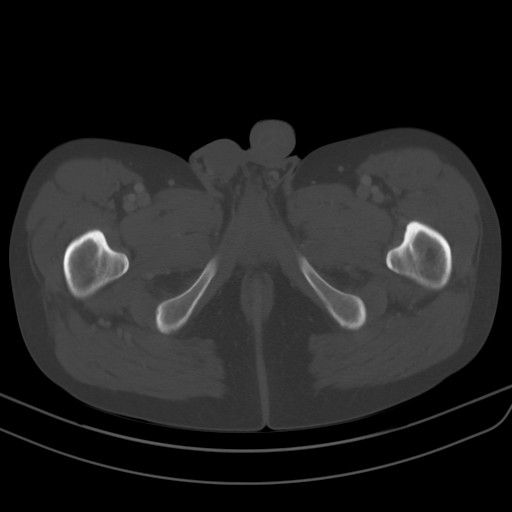
[im 13/98  soft-tissue]
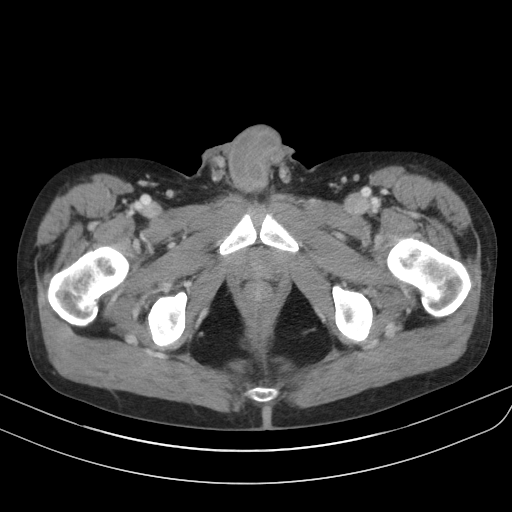
[im 20/98  soft-tissue]
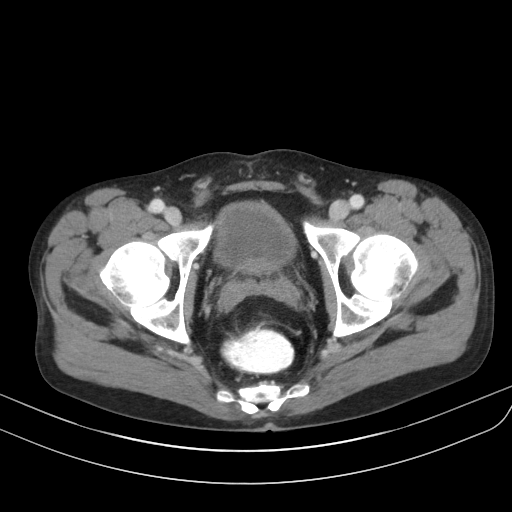
[im 26/98  soft-tissue]
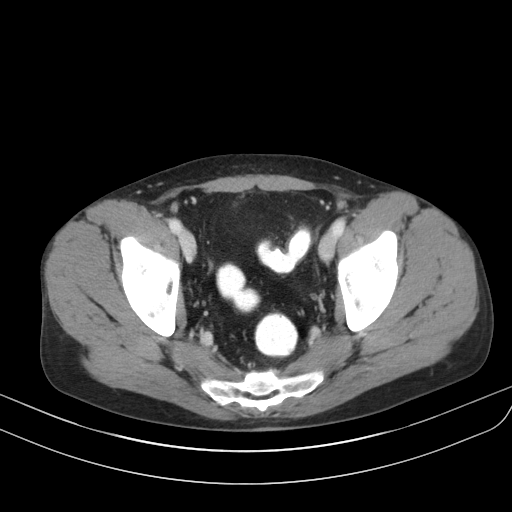
[im 33/98  soft-tissue]
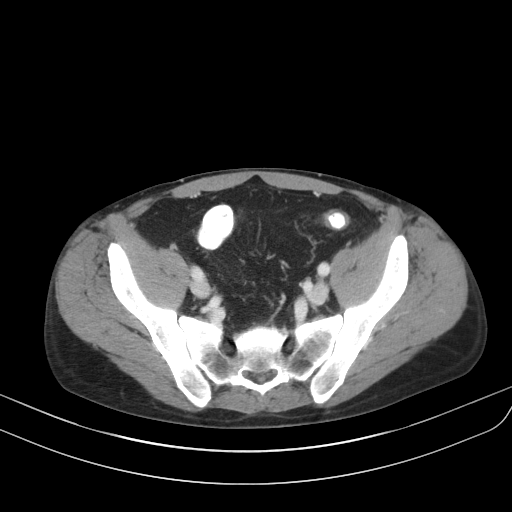
[im 39/98  soft-tissue]
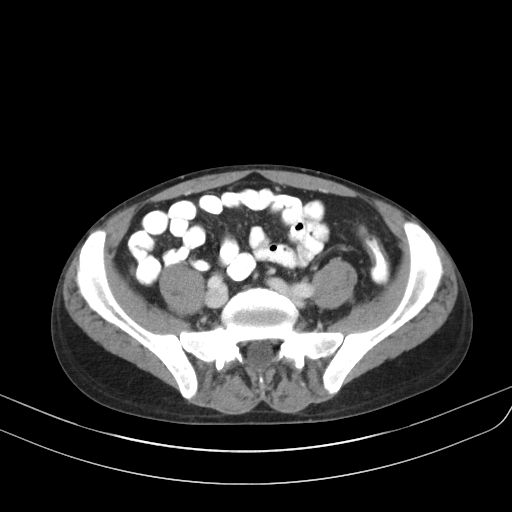
[im 52/98  soft-tissue]
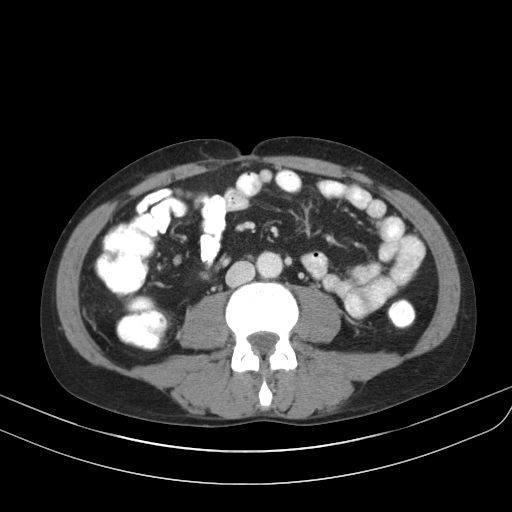
[im 59/98  soft-tissue]
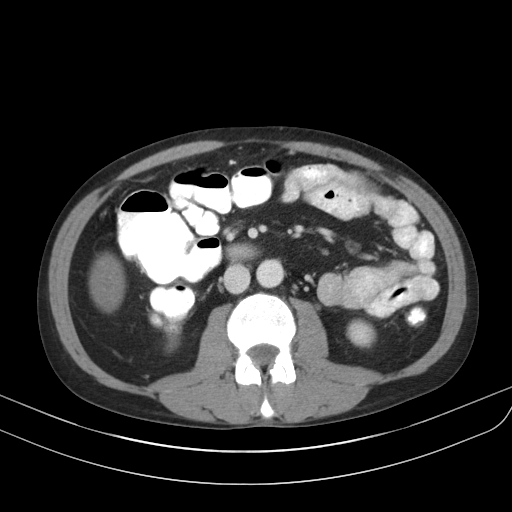
[im 65/98  soft-tissue]
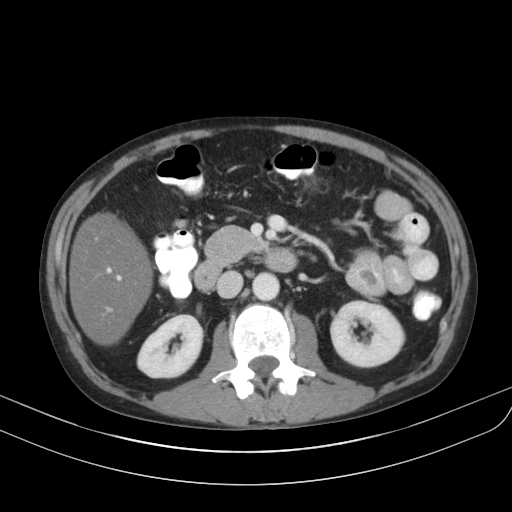
[im 65/98  bone]
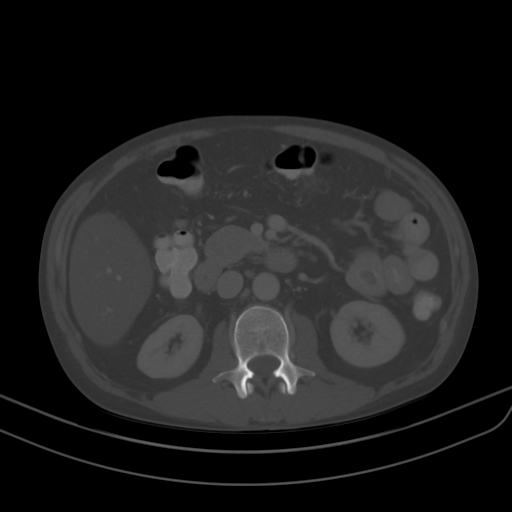
[im 72/98  soft-tissue]
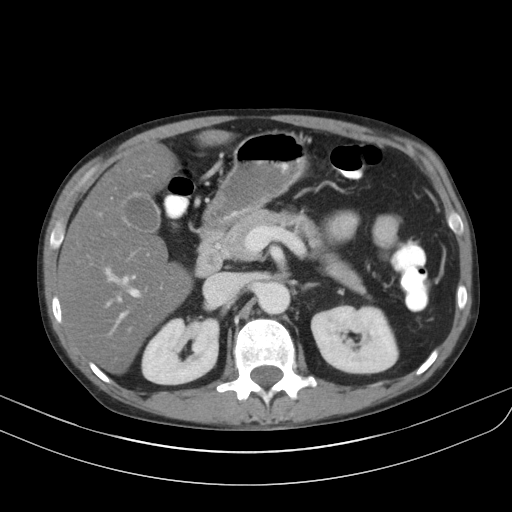
[im 72/98  lung]
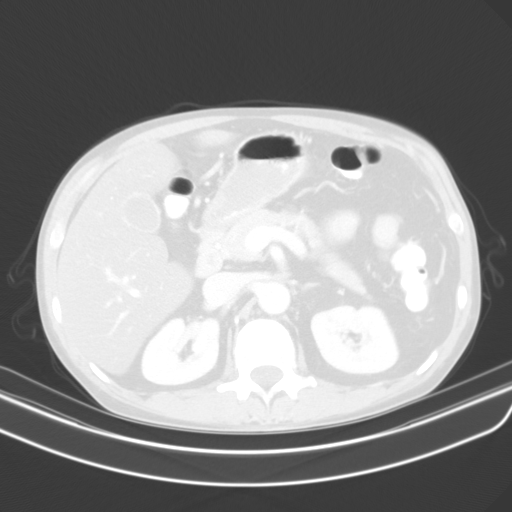
[im 78/98  soft-tissue]
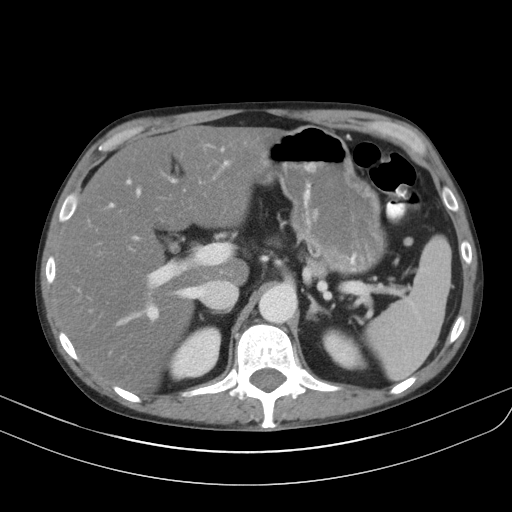
[im 78/98  lung]
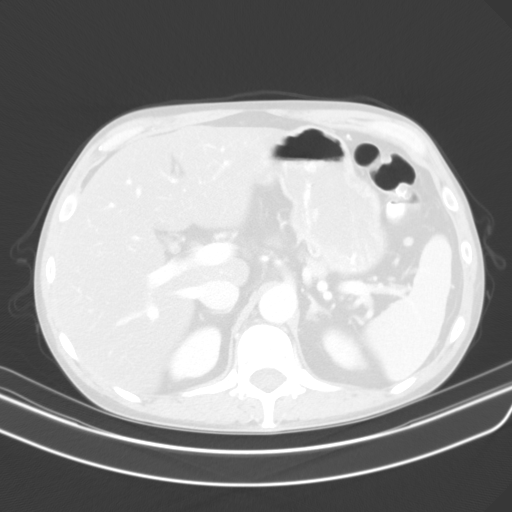
[im 85/98  soft-tissue]
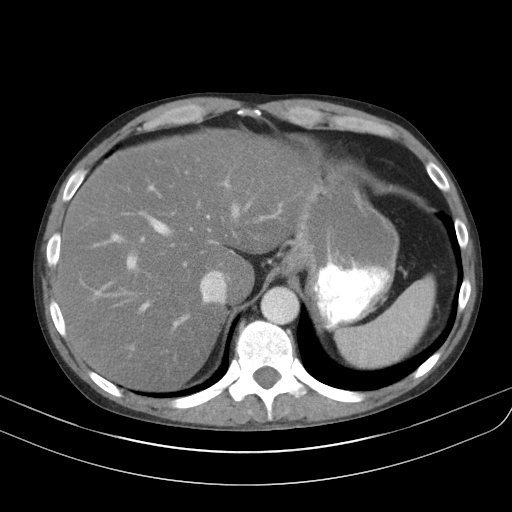
[im 85/98  lung]
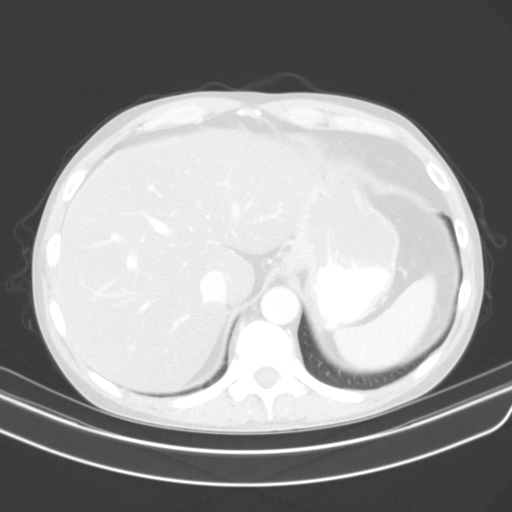
[im 91/98  soft-tissue]
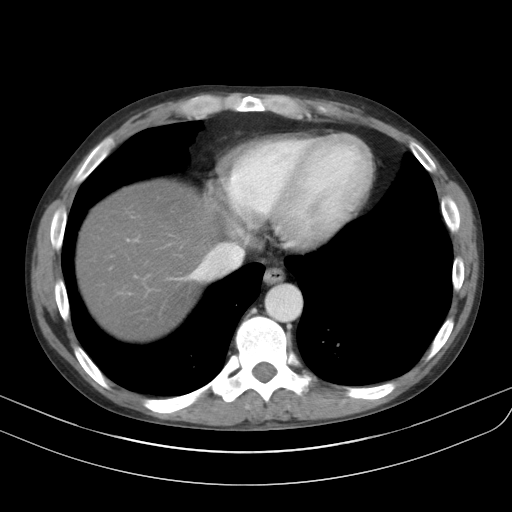
[im 91/98  lung]
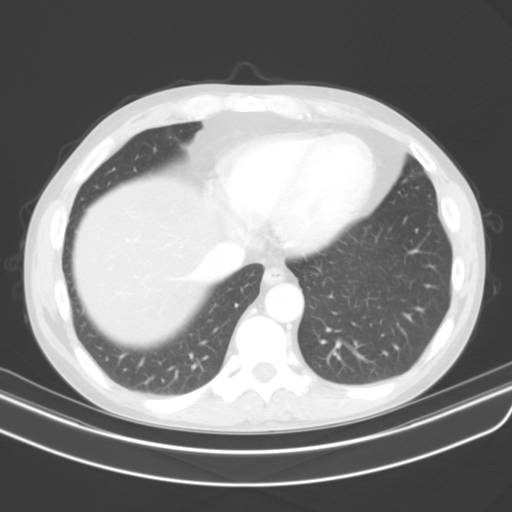

[13 of 32 positions shown; findings below may reference images not displayed]

FINDINGS: Lower chest: No acute abnormality.

Hepatobiliary: No solid liver abnormality is seen. Severe hepatic
steatosis. No gallstones, gallbladder wall thickening, or biliary
dilatation.

Pancreas: Unremarkable. No pancreatic ductal dilatation or
surrounding inflammatory changes.

Spleen: Normal in size without significant abnormality.

Adrenals/Urinary Tract: Adrenal glands are unremarkable. Kidneys are
normal, without renal calculi, solid lesion, or hydronephrosis. Mild
thickening of the urinary bladder wall.

Stomach/Bowel: Stomach is within normal limits. Appendix appears
normal. No evidence of bowel wall thickening, distention, or
inflammatory changes.

Vascular/Lymphatic: No significant vascular findings are present. No
enlarged abdominal or pelvic lymph nodes.

Reproductive: No mass or other significant abnormality.

Other: No abdominal wall hernia or abnormality. No abdominopelvic
ascites.

Musculoskeletal: No acute or significant osseous findings.
IMPRESSION: 1. No acute CT findings of the abdomen or pelvis to explain
abdominal pain. Specifically, there is no imaging evidence of
inflammatory bowel disease such as ulcerative colitis or Crohn's
disease nor related complication.
2. Severe hepatic steatosis.
3. Mild thickening of the urinary bladder wall, suggestive of
nonspecific infectious or inflammatory cystitis. Correlate with
urinalysis.

## 2023-10-05 ENCOUNTER — Other Ambulatory Visit: Payer: Self-pay | Admitting: Internal Medicine

## 2023-10-05 DIAGNOSIS — F3181 Bipolar II disorder: Secondary | ICD-10-CM

## 2023-11-05 ENCOUNTER — Other Ambulatory Visit: Payer: Self-pay | Admitting: Internal Medicine

## 2023-11-05 DIAGNOSIS — F1091 Alcohol use, unspecified, in remission: Secondary | ICD-10-CM

## 2023-11-05 DIAGNOSIS — F331 Major depressive disorder, recurrent, moderate: Secondary | ICD-10-CM

## 2023-12-06 ENCOUNTER — Encounter: Payer: Self-pay | Admitting: Internal Medicine

## 2023-12-06 ENCOUNTER — Ambulatory Visit: Payer: BC Managed Care – PPO | Admitting: Internal Medicine

## 2023-12-06 VITALS — BP 132/82 | HR 93 | Temp 98.2°F | Ht 69.0 in | Wt 177.2 lb

## 2023-12-06 DIAGNOSIS — G2581 Restless legs syndrome: Secondary | ICD-10-CM

## 2023-12-06 DIAGNOSIS — F3181 Bipolar II disorder: Secondary | ICD-10-CM

## 2023-12-06 DIAGNOSIS — R1033 Periumbilical pain: Secondary | ICD-10-CM

## 2023-12-06 DIAGNOSIS — Z0001 Encounter for general adult medical examination with abnormal findings: Secondary | ICD-10-CM

## 2023-12-06 DIAGNOSIS — E569 Vitamin deficiency, unspecified: Secondary | ICD-10-CM | POA: Diagnosis not present

## 2023-12-06 DIAGNOSIS — R1010 Upper abdominal pain, unspecified: Secondary | ICD-10-CM

## 2023-12-06 DIAGNOSIS — Z125 Encounter for screening for malignant neoplasm of prostate: Secondary | ICD-10-CM | POA: Diagnosis not present

## 2023-12-06 DIAGNOSIS — R292 Abnormal reflex: Secondary | ICD-10-CM

## 2023-12-06 DIAGNOSIS — D7589 Other specified diseases of blood and blood-forming organs: Secondary | ICD-10-CM

## 2023-12-06 DIAGNOSIS — N182 Chronic kidney disease, stage 2 (mild): Secondary | ICD-10-CM

## 2023-12-06 DIAGNOSIS — Z8042 Family history of malignant neoplasm of prostate: Secondary | ICD-10-CM | POA: Diagnosis not present

## 2023-12-06 LAB — COMPREHENSIVE METABOLIC PANEL WITH GFR
ALT: 19 U/L (ref 0–53)
AST: 20 U/L (ref 0–37)
Albumin: 4.8 g/dL (ref 3.5–5.2)
Alkaline Phosphatase: 43 U/L (ref 39–117)
BUN: 11 mg/dL (ref 6–23)
CO2: 31 meq/L (ref 19–32)
Calcium: 10 mg/dL (ref 8.4–10.5)
Chloride: 103 meq/L (ref 96–112)
Creatinine, Ser: 1.3 mg/dL (ref 0.40–1.50)
GFR: 66.58 mL/min (ref >60.00)
Glucose, Bld: 101 mg/dL — ABNORMAL HIGH (ref 70–99)
Potassium: 4.5 meq/L (ref 3.5–5.1)
Sodium: 140 meq/L (ref 135–145)
Total Bilirubin: 0.4 mg/dL (ref 0.2–1.2)
Total Protein: 7.2 g/dL (ref 6.0–8.3)

## 2023-12-06 LAB — CBC WITH DIFFERENTIAL/PLATELET
Basophils Absolute: 0.1 K/uL (ref 0.0–0.1)
Basophils Relative: 2 % (ref 0.0–3.0)
Eosinophils Absolute: 0.2 K/uL (ref 0.0–0.7)
Eosinophils Relative: 3.3 % (ref 0.0–5.0)
HCT: 47.6 % (ref 39.0–52.0)
Hemoglobin: 16.1 g/dL (ref 13.0–17.0)
Lymphocytes Relative: 40.6 % (ref 12.0–46.0)
Lymphs Abs: 1.9 K/uL (ref 0.7–4.0)
MCHC: 33.8 g/dL (ref 30.0–36.0)
MCV: 97.2 fl (ref 78.0–100.0)
Monocytes Absolute: 0.4 K/uL (ref 0.1–1.0)
Monocytes Relative: 8.4 % (ref 3.0–12.0)
Neutro Abs: 2.2 K/uL (ref 1.4–7.7)
Neutrophils Relative %: 45.7 % (ref 43.0–77.0)
Platelets: 259 K/uL (ref 150.0–400.0)
RBC: 4.9 Mil/uL (ref 4.22–5.81)
RDW: 13.3 % (ref 11.5–15.5)
WBC: 4.8 K/uL (ref 4.0–10.5)

## 2023-12-06 LAB — LIPID PANEL
Cholesterol: 229 mg/dL — ABNORMAL HIGH (ref 0–200)
HDL: 49.7 mg/dL (ref >39.00)
LDL Cholesterol: 161 mg/dL — ABNORMAL HIGH (ref 0–99)
NonHDL: 179.22
Total CHOL/HDL Ratio: 5
Triglycerides: 93 mg/dL (ref 0.0–149.0)
VLDL: 18.6 mg/dL (ref 0.0–40.0)

## 2023-12-06 LAB — VITAMIN B12: Vitamin B-12: 595 pg/mL (ref 211–911)

## 2023-12-06 LAB — PSA: PSA: 1.96 ng/mL (ref 0.10–4.00)

## 2023-12-06 MED ORDER — SERTRALINE HCL 25 MG PO TABS: 25.0000 mg | ORAL_TABLET | Freq: Every day | ORAL | 3 refills | Status: DC

## 2023-12-06 MED ORDER — ARIPIPRAZOLE 5 MG PO TABS: 5.0000 mg | ORAL_TABLET | Freq: Every day | ORAL | 3 refills | Status: AC

## 2023-12-06 NOTE — Patient Instructions (Signed)
 VISIT SUMMARY: You came in for your annual physical exam and to discuss some concerns, including abdominal tenderness. We reviewed your overall health, including your history of alcohol use, vitamin B deficiency, and restless legs. We also discussed your current medications and some side effects you are experiencing. Additionally, we talked about your family history of prostate cancer and your recent PSA testing.  YOUR PLAN: -ADULT WELLNESS VISIT: This was a routine check-up to review your general health and preventive care. We discussed vaccinations and cancer screenings. You declined the Gardasil vaccine due to being in a long-term monogamous relationship. Your colon cancer screening is complete, and we will perform a PSA test due to your family history of prostate cancer. A flu shot was also offered.  -BIPOLAR II DISORDER: Bipolar II disorder is a mental health condition characterized by mood swings. Your current medications are Abilify  and Lexapro . You reported difficulty achieving orgasm, likely due to Lexapro . We discussed switching to Zoloft  25 mg and increasing Abilify  to 5 mg. If you switch to Zoloft , we may taper off Lexapro .  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 You are currently taking B complex vitamins and gabapentin . Continue with these, and we will check your B12 levels. If symptoms persist, we may adjust your gabapentin  to focus on nighttime use.  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 You have tenderness in your midline abdomen, which may be a hernia. We will order an abdominal ultrasound to evaluate this further.  INSTRUCTIONS: Please follow up for the PSA test  and the abdominal ultrasound as discussed. Continue taking your current medications as prescribed, and monitor any changes in your symptoms. If you switch to Zoloft , follow the tapering plan for Lexapro  as instructed. Make sure to get your B12 levels checked and adjust your gabapentin  dosing if needed.  Building Your Long-Term Health Plan  During today's preventive visit, we covered a variety of important health checks to help you stay on top of your well-being.  We also discussed strategies to maintain your health and identified some areas that might benefit from further exploration.   Preventive care visits like today's are designed to be proactive, but sometimes additional attention may be needed.  Rest assured, we're here for you.  If these areas require further evaluation or management, we'd be happy to schedule a separate, focused appointment to address them in detail.  Addressing Next Steps  [x]   Follow-up Visit: To ensure we address any unresolved issues and continue monitoring your overall health, we recommend scheduling a follow-up appointment in 1 year for your next preventive care visit. If you experience any new problems, need to discuss any medical concerns, or your condition worsens before then, please don't hesitate to call our office to schedule an appointment or seek emergency care as needed.  [x]   Preventive Measures: Maintaining healthy habits plays a crucial role in overall wellness. We recommend considering these tips: [x]   Regular appointments with dental and vision professionals [x]   Nightly nasal saline mist to keep sinuses clear [x]   Consistent toothbrushing to maintain oral health [x]   Using an app like [employer] to track sleep quality [x]   Routine checks of blood pressure and heart rate [x]   Medical Information: In some instances, we  may require additional medical information from other providers to create a comprehensive picture of your health. If applicable, we can  provide a medical information release form at the front desk for you to sign, allowing us  to gather these records. [x]   Lab Tests: If any lab tests were ordered today, scheduling them within a week of your visit helps ensure the best possible insurance coverage.  Planning Follow Up to Work on a Problem? Make the Most of Our Focused (20 minute) Appointments  [x]   Clearly state your top concerns at the beginning of the visit to focus our discussion [x]   If you anticipate you will need more time, please inform the front desk during scheduling - we can book multiple appointments in the same week. [x]   If you have transportation problems- use our convenient video appointments or ask about transportation support. [x]   We can get down to business faster if you use MyChart to update information before the visit and submit non-urgent questions before your visit. Thank you for taking the time to provide details through MyChart.  Let our nurse know and she can import this information into your encounter documents.  Arrival and Wait Times  [x]   Arriving on time ensures that everyone receives prompt attention. [x]   Early morning (8a) and afternoon (1p) appointments tend to have shortest wait times. [x]   Unfortunately, we cannot delay appointments for late arrivals or hold slots during phone calls.  Bring to Your Next Appointment:  [x]   Medications: Please bring all your medication bottles to your next appointment to ensure we have an accurate record of your prescriptions. [x]   Health Diaries: If you're monitoring any health conditions at home, keeping a diary of your readings can be very helpful for discussions at your next appointment.  Reviewing Your Records  [x]   Review your attached preventive care information at the end of these patient instructions. [x]   Review this early draft of your clinical encounter notes below and the final encounter summary tomorrow on MyChart after its been completed.       Getting Answers and Following Up  [x]   Simple Questions & Concerns: For quick questions or basic follow-up after your visit, reach us  at [phone] or MyChart messaging. [x]   Complex Concerns: If your concern is more complex, scheduling an appointment might be best. Discuss this with the staff to find the most suitable option. [x]   Lab & Imaging Results: We'll contact you directly if results are abnormal or you don't use MyChart. Most normal results will be on MyChart within 2-3 business days, with a review message from Dr. Jesus. Haven't heard back in 2 weeks? Need results sooner? Contact us  at [phone]. [x]   Referrals: Our referral coordinator will manage specialist referrals. The specialist's office should contact you within 2 weeks to schedule an appointment. Call us  if you haven't heard from them after 2 weeks.  Staying Connected  [x]   MyChart: Activate your MyChart for the fastest way to access results and message us . See the last page of this paperwork for instructions on how to activate.  Billing  [x]   X-ray & Lab Orders: These are billed by separate companies. Contact the invoicing company directly for questions or concerns. [x]   Visit Charges: Discuss any billing inquiries with our administrative services team.  Your Satisfaction Matters  [x]   Share Your Experience: We strive for your satisfaction! If you have any complaints, or preferably compliments, please let Dr. Jesus know directly or contact our Practice Administrators,  Manuelita Rubin or [employer], by asking at the front desk.                 Next Steps  [x]   Schedule Follow-Up:  We recommend a follow-up appointment in 1 year for your next wellness visit.  If you develop any new problems, want to address any medical issues, or your condition worsens before then, please call us  for an appointment or seek emergency care. [x]   Preventive Care:  Make sure to keep regular appointments with dental  and vision professionals, use nightly nasal saline mist sprays to keep your sinuses clear and toothbrushing to protect your teeth. Use [employer] App or other app to track your sleep quality. Check blood pressure and heart rate routinely. [x]   Medical Information Release:  For any relevant medical information we don't have, please sign a release form at the front desk so we can obtain it for your records. [x]   Lab Tests:  Schedule any lab tests from today for within a week to ensure best insurance coverage.    Making the Most of Our Focused (20 minute) Appointments:  [x]   Clearly state your top concerns at the beginning of the visit to focus our discussion [x]   If you anticipate you will need more time, please inform the front desk during scheduling - we can book multiple appointments in the same week. [x]   If you have transportation problems- use our convenient video appointments or ask about transportation support. [x]   We can get down to business faster if you use MyChart to update information before the visit and submit non-urgent questions before your visit. Thank you for taking the time to provide details through MyChart.  Let our nurse know and she can import this information into your encounter documents.  Arrival and Wait Times: [x]   Arriving on time ensures that everyone receives prompt attention. [x]   Early morning (8a) and afternoon (1p) appointments tend to have shortest wait times. [x]   Unfortunately, we cannot delay appointments for late arrivals or hold slots during phone calls.  Bring to Your Next Appointment  [x]   Medications: Please bring all your medication bottles to your next appointment to ensure we have an accurate record of your prescriptions. [x]   Health Diaries: If you're monitoring any health conditions at home, keeping a diary of your readings can be very helpful for discussions at your next appointment.  Reviewing Your Records  [x]   Review your attached preventive  care information at the end of these patient instructions. [x]   Review this early draft of your clinical encounter notes below and the final encounter summary tomorrow on MyChart after its been completed.   Encounter for annual general medical examination with abnormal findings in adult -     Lipid panel -     Comprehensive metabolic panel with GFR -     CBC with Differential/Platelet -     TSH Rfx on Abnormal to Free T4 -     PSA -     Vitamin B12 -     Methylmalonic acid, serum  Family history of prostate cancer -     PSA  Pain of upper abdomen -     US  Abdomen Complete; Future  Macrocytosis -     TSH Rfx on Abnormal to Free T4 -     Vitamin B12 -     Methylmalonic acid, serum  Vitamin deficiency -     Vitamin B12 -     Methylmalonic acid, serum  Bipolar 2 disorder (HCC) Assessment & Plan: Bipolar II disorder   Bipolar II disorder is managed with Abilify  and Lexapro . He reports difficulty achieving orgasm, likely a side effect of Lexapro . Discussed alternative medications and adjustments to the current regimen. Prescribe Zoloft  25 mg as an alternative to Lexapro , with a discussion of potential emotional risks. Increase Abilify  to 5 mg to reduce SSRI dependency. Consider tapering off Lexapro  if switching to Zoloft .  Orders: -     Sertraline  HCl; Take 1 tablet (25 mg total) by mouth daily.  Dispense: 30 tablet; Refill: 3 -     ARIPiprazole ; Take 1 tablet (5 mg total) by mouth daily.  Dispense: 90 tablet; Refill: 3  Chronic kidney disease, stage 2 (mild)  Restless legs  Hyperreflexic  Periumbilical abdominal pain     Getting Answers and Following Up  [x]   Simple Questions & Concerns: For quick questions or basic follow-up after your visit, reach us  at [phone] or MyChart messaging. [x]   Complex Concerns: If your concern is more complex, scheduling an appointment might be best. Discuss this with the staff to find the most suitable option. [x]   Lab & Imaging  Results: We'll contact you directly if results are abnormal or you don't use MyChart. Most normal results will be on MyChart within 2-3 business days, with a review message from Dr. Jesus. Haven't heard back in 2 weeks? Need results sooner? Contact us  at [phone]. [x]   Referrals: Our referral coordinator will manage specialist referrals. The specialist's office should contact you within 2 weeks to schedule an appointment. Call us  if you haven't heard from them after 2 weeks.  Staying Connected  [x]   MyChart: Activate your MyChart for the fastest way to access results and message us . See the last page of this paperwork for instructions on how to activate.  Billing  [x]   X-ray & Lab Orders: These are billed by separate companies. Contact the invoicing company directly for questions or concerns. [x]   Visit Charges: Discuss any billing inquiries with our administrative services team.  Your Satisfaction Matters  [x]   Share Your Experience: We strive for your satisfaction! If you have any complaints, or preferably compliments, please let Dr. Jesus know directly or contact our Practice Administrators, Manuelita Rubin or [employer], by asking at the front desk.    Medical Screening Exam A medical screening exam (MSE) helps to determine whether you need immediate medical treatment relating to any number of symptoms you are having. This type of exam may be done in an emergency department, an urgent care setting, or your health care provider's office. Depending on your symptoms and severity, you may need additional tests or medical therapy. It is important to note that an MSE does not necessarily mean that you will need or receive further medical testing or interventions if your symptoms are not deemed to be medically urgent (emergent). Tell a health care provider about: Any allergies you have. All medicines you are taking, including vitamins, herbs, eye drops, creams, and over-the-counter  medicines. Any problems you or family members have had with anesthetic medicines. Any bleeding problems you have. Any surgeries you have had. Any medical conditions you have. Whether you are pregnant or may be pregnant. What happens during the test? During the exam, a health care provider does a short, often focused, physical exam and asks about your medical history to assess: Your current symptoms. Your overall health. Your need for possible further medical intervention. What can I expect after the test?  If you have a regular health care provider, make an appointment for a follow-up visit with him or her. If you do not have a regular health care provider, ask about resources in your community. Your medical screening exam may determine that: You do not need emergency treatment at this time. You need treatment right away. You need to be transferred to another medical center. This may happen if you need an emergent specialist or consultant that is not available at the medical center you are at. You need to have more tests. A medical specialist may be consulted if needed. Get help right away if: Your condition gets worse. You develop new or troubling symptoms before you see your health care provider. These symptoms may represent a serious problem that is an emergency. Do not wait to see if the symptoms will go away. Get medical help right away. Call your local emergency services (911 in the [place] Do not drive yourself to the hospital. Summary A medical screening exam helps to determine whether you need medical treatment right away. This type of exam may be done in an emergency department, an urgent care setting, or your health care provider's office. During the exam, a health care provider does a short physical exam and asks about your current symptoms and overall health. Depending on the exam, more tests or therapies may be ordered. However, an MSE does not necessarily mean that you will  have further medical testing if your symptoms are not deemed to be urgent. If you need further care that is not offered at your current medical center, you may need to be transferred to another facility. This information is not intended to replace advice given to you by your health care provider. Make sure you discuss any questions you have with your health care provider. Document Revised: 11/27/2020 Document Reviewed: 07/25/2020 Elsevier Patient Education  2024 [employer].

## 2023-12-06 NOTE — Progress Notes (Signed)
 [facility] [address] [place], [place] [address] Office:  [phone]  -- Annual Preventive Medical Office Visit --  Patient:  Brett Rubio      Age: 43 y.o.       Sex:  male  Date:   12/06/2023 Patient Care Team: Jesus Bernardino MATSU, MD as PCP - General (Internal Medicine) Rollin Dover, MD as Consulting Physician (Gastroenterology) Today's Healthcare Provider: Bernardino MATSU Jesus, MD  ========================================= Chief complaint: Annual Exam (Pt is present for cpe pt is fasting for labs just had black coffee to drink,)  Purpose of Visit: Comprehensive preventive health assessment and personalized health maintenance planning.  This encounter was conducted as a Comprehensive Physical Exam (CPE) preventive care annual visit. The patient's medical history and problem list were reviewed to inform individualized preventive care recommendations.   No problem-specific medical treatment was provided during this visit.  Assessment & Plan Encounter for annual general medical examination with abnormal findings in adult Family history of prostate cancer Routine adult wellness visit with no acute concerns. Discussed general health maintenance, including vaccinations and cancer screenings. He is eligible for the Gardasil vaccine but declines due to a long-term monogamous relationship. Colon cancer screening is complete. A PSA test is indicated due to a family history of prostate cancer. Offer a flu shot and perform the PSA test. Bipolar 2 disorder (HCC) Bipolar II disorder   Bipolar II disorder is managed with Abilify  and Lexapro . He reports difficulty achieving orgasm, likely a side effect of Lexapro . Discussed alternative medications and adjustments to the current regimen. Prescribe Zoloft  25 mg as an alternative to Lexapro , with a discussion of potential emotional risks. Increase Abilify  to 5 mg to reduce SSRI dependency. Consider tapering off Lexapro  if  switching to Zoloft . 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 Symptoms include restless legs and hyperreflexia. Currently managed with B complex vitamins and gabapentin . Continue B complex supplementation and check B12 levels. Adjust gabapentin  dosing to focus on nighttime if symptoms persist. Periumbilical abdominal pain Abdominal wall ?hernia, just above umbilicus midline, under evaluation   A midline abdominal wall hernia is suspected. He reports tenderness and concern during workouts. An ultrasound is preferred over an x-ray for evaluation due to better diagnostic capability and no radiation exposure. Order an abdominal ultrasound to evaluate the hernia and discuss potential findings and implications.     ICD-10-CM   1. Encounter for annual general medical examination with abnormal findings in adult  Z00.01 Lipid panel    Comprehensive metabolic panel with GFR    CBC with Differential/Platelet    TSH Rfx on Abnormal to Free T4    PSA    B12    Methylmalonic Acid    2. Family history of prostate cancer  Z80.42 PSA    3. Pain of upper abdomen  R10.10 US  Abdomen Complete    4. Macrocytosis  D75.89 TSH Rfx on Abnormal to Free T4    B12    Methylmalonic Acid    5. Vitamin deficiency  E56.9 B12    Methylmalonic Acid    6. Bipolar 2 disorder (HCC)  F31.81 sertraline  (ZOLOFT ) 25 MG tablet    ARIPiprazole  (ABILIFY ) 5 MG tablet    7. Chronic kidney disease, stage 2 (mild)  N18.2     8. Restless legs  G25.81  9. Hyperreflexic  R29.2     10. Periumbilical abdominal pain  R10.33       Reviewed/updated/encouraged completion: Immunization History  Administered Date(s) Administered   Influenza-Unspecified 12/19/2021   PFIZER(Purple Top)SARS-COV-2 Vaccination 12/10/2019   Pfizer Covid-19  Vaccine Bivalent Booster 91yrs & up 12/07/2020   Tdap 10/20/2019   Unspecified SARS-COV-2 Vaccination 12/19/2021   Health Maintenance Due  Topic Date Due   HPV VACCINES (1 - 3-dose SCDM series) Never done   Hepatitis B Vaccines 19-59 Average Risk (2 of 2 - CpG 2-dose series) 11/26/2022   COVID-19 Vaccine (7 - 2025-26 season) 11/29/2023   Health Maintenance  Topic Date Due   HPV VACCINES (1 - 3-dose SCDM series) Never done   Hepatitis B Vaccines 19-59 Average Risk (2 of 2 - CpG 2-dose series) 11/26/2022   COVID-19 Vaccine (7 - 2025-26 season) 11/29/2023   DTaP/Tdap/Td (2 - Td or Tdap) 10/19/2029   Colonoscopy  01/08/2032   Pneumococcal Vaccine  Completed   Hepatitis C Screening  Completed   HIV Screening  Completed   Meningococcal B Vaccine  Aged Out   Influenza Vaccine  Discontinued    Reviewed the following verbally with patient and provided AVS materials:   HEALTH MAINTENANCE COUNSELING AND ANTICIPATORY GUIDANCE    Preventive Measure Recommendation  Eye Exams Every 1-2 years  Dental Care Cleanings every 6 months or more, brush/floss 3x daily  Sinus Care Saline spray rinses daily  Sleep 8 hours nightly, good sleep hygiene, e-monitoring if any daytime drowsiness  Diet Fruits/vegetables/fiber/healthy fats, balance and moderation  Exercise 150 minutes weekly  Risk Behaviors Discouraged any/all high risk behaviors    CANCER SCREENING SHARED DECISION MAKING    Penile/Testicle/Scrotum Encouraged self-monitoring and reporting of genital abnormalities. Patient reports none.  Thyroid  Checked and advised to palpate thyroid  for nodules  Prostate Individualized risks/benefits/costs discussed Lab Results  Component Value Date   PSA 1.96 12/06/2023   PSA 1.49 12/03/2022   PSA 2.37 12/29/2021    Colon HM Colonoscopy          Upcoming     Colonoscopy (Every 10 Years) Next due on 01/08/2032    01/07/2022  Outside Claim: PR COLONOSCOPY W/BIOPSY SINGLE/MULTIPLE   Only the  first 1 history entries have been loaded, but more history exists.                Lung Current guidelines recommend individuals aged 41 to 80 who currently smoke or formerly smoked and have a >= 20 pack-year smoking history should undergo annual screening with low-dose computed tomography (LDCT). Tobacco Use: Medium Risk (12/06/2023)   Patient History    Smoking Tobacco Use: Former    Smokeless Tobacco Use: Never    Passive Exposure: Not on file   Social History   Tobacco Use  Smoking Status Former   Types: Cigarettes  Smokeless Tobacco Never    Skin Advised regular sunscreen use. Patient denies worrisome, changing, or new skin lesions.   Other Cancers Discussed lack of screening guidelines and insurance coverage for other cancer types.    Discussed the use of AI scribe software for clinical note transcription with the patient, who gave verbal consent to proceed.  History of Present Illness 43 year old male who presents for an annual physical exam and evaluation of abdominal tenderness.  He is experiencing persistent tenderness in the midline of the abdomen where the abdominal muscles meet, which is interfering with his workouts. He recalls mentioning this issue during a previous visit  and is concerned it might be a hernia.  He has a history of alcohol use and vitamin B deficiency, and reports restless legs at night. He is currently taking gabapentin  three times a day and a B complex supplement.  He is on Lexapro  for depression but experiences anorgasmia, which is affecting his marriage. He previously tried Wellbutrin but experienced adverse effects such as feeling very shaky.  He has a family history of prostate cancer, as his father had the condition. He has undergone PSA testing last year.  He has a history of ear surgeries and dental work, with more dental procedures planned. No pain, lumps, bumps, or skin spots of concern. He practices sun protection by wearing long sleeves  and staying indoors during the summer.  He maintains a healthy lifestyle, including weightlifting with barbells, and reports no current issues with alcohol use.    ROS A comprehensive ROS was negative for any concerning symptoms.   Completed medication reconciliation: Current Outpatient Medications on File Prior to Visit  Medication Sig   acamprosate  (CAMPRAL ) 333 MG tablet TAKE 2 TABLETS BY MOUTH THREE TIMES DAILY WITH MEALS AS NEEDED TO RELIEVE STRESS AND REDUCE ALCOHOL CRAVINGS   albuterol  (VENTOLIN  HFA) 108 (90 Base) MCG/ACT inhaler Inhale 1 puff into the lungs every 4 (four) hours as needed for wheezing or shortness of breath.   ANASTROZOLE PO Take by mouth once a week. Takes every Friday.   ARIPiprazole  (ABILIFY ) 2 MG tablet TAKE 1 TABLET(2 MG) BY MOUTH DAILY   gabapentin  (NEURONTIN ) 300 MG capsule Take 1 capsule (300 mg total) by mouth 3 (three) times daily.   HUMIRA, 2 PEN, 40 MG/0.4ML PNKT SMARTSIG:40 Milligram(s) SUB-Q Every 2 Weeks   HUMIRA-CD/UC/HS STARTER 80 MG/0.8ML PNKT Inject into the skin.   Multiple Vitamins-Minerals (MULTIVITAMIN ADULTS PO) Take 1 tablet by mouth daily.   testosterone enanthate (DELATESTRYL) 200 MG/ML injection Inject 200 mg into the muscle every 7 (seven) days. For IM use only   traZODone  (DESYREL ) 50 MG tablet TAKE 1 TABLET(50 MG) BY MOUTH AT BEDTIME AS NEEDED   valACYclovir  (VALTREX ) 500 MG tablet Take 1 tablet (500 mg total) by mouth every morning.   No current facility-administered medications on file prior to visit.   Medications Discontinued During This Encounter  Medication Reason   escitalopram  (LEXAPRO ) 20 MG tablet Side effect (s)  The following were reviewed and/or entered/updated into our electronic MEDICAL RECORD NUMBERPast Medical History:  Diagnosis Date   Abnormal PSA 12/29/2021   Lab Results  Component  Value  Date     PSA  2.37  12/29/2021            Alcohol dependence (HCC) 11/26/2021   History of delirium tremens Did medical detox  with librium/trazodone , at [facility]. Not a fan of 12 steps    GERD (gastroesophageal reflux disease) 11/10/2015   He takes omeprazole over-the-counter long-term.  Dr. Rollin does not have a problem with it   History of ADHD 12/29/2021   History of pseudomembranous enterocolitis 11/11/2015   Moderate alcohol use disorder [facility]) 11/26/2021   September 11, 2022 interim history:   reporting he is remaining in recovery, recently did 7 days vacation without alcohol and enjoyed it with his family, still taking gabapentin /acamprosate  as prescribed.     Prior history:   Patient is imperfect control but doing really well and participating in Smart recovery programs  History of delirium tremens  Did medical [facility]   Persistent depressive disorder with  melancholic features, currently severe 12/29/2021  History reviewed. No pertinent surgical history. Social History   Socioeconomic History   Marital status: Married    Spouse name: Not on file   Number of children: Not on file   Years of education: Not on file   Highest education level: Not on file  Occupational History   Not on file  Tobacco Use   Smoking status: Former    Types: Cigarettes   Smokeless tobacco: Never  Vaping Use   Vaping status: Never Used  Substance and Sexual Activity   Alcohol use: Not Currently    Comment: Formerly-15+ glasses of wine, 30+ cans of beer   Drug use: Not Currently   Sexual activity: Yes  Other Topics Concern   Not on file  Social History Narrative   Not on file   Social Drivers of Health   Financial Resource Strain: Not on file  Food Insecurity: Not on file  Transportation Needs: Not on file  Physical Activity: Not on file  Stress: Not on file  Social Connections: Unknown (08/12/2021)   Received from [facility]   Social Network    Social Network: Not on file  Intimate Partner Violence: Unknown (07/04/2021)   Received from [facility]   HITS    Physically Hurt:  Not on file    Insult or Talk Down To: Not on file    Threaten Physical Harm: Not on file    Scream or Curse: Not on file      01/29/2022    9:34 AM  Alcohol Use Disorder Test (AUDIT)  1. How often do you have a drink containing alcohol? 2  2. How many drinks containing alcohol do you have on a typical day when you are drinking? 0  3. How often do you have six or more drinks on one occasion? 1  AUDIT-C Score 3   Family History  Problem Relation Age of Onset   Cancer Father    Alcohol abuse Brother    Allergies  Allergen Reactions   Sulfa Antibiotics Nausea And Vomiting    REACTION: nausea   Erythromycin     REACTION: nausea   Sulfonamide Derivatives     REACTION: nausea   Social History   Substance and Sexual Activity  Sexual Activity Yes  @    12/06/2023    8:04 AM  Depression screen PHQ 2/9  Decreased Interest 1  Down, Depressed, Hopeless 0  PHQ - 2 Score 1  Altered sleeping 0  Tired, decreased energy 0  Change in appetite 0  Feeling bad or failure about yourself  0  Trouble concentrating 0  Moving slowly or fidgety/restless 0  Suicidal thoughts 0  PHQ-9 Score 1  Difficult doing work/chores Not difficult at all      06/02/2023    8:13 AM  Fall Risk   Falls in the past year? 0  Number falls in past yr: 0  Injury with Fall? 0  Risk for fall due to : No Fall Risks  Follow up Falls evaluation completed     BP 132/82   Pulse 93   Temp 98.2 F (36.8 C) (Temporal)   Ht 5' 9 (1.753 m)   Wt 177 lb 3.2 oz (80.4 kg)   SpO2 98%   BMI 26.17 kg/m  BP Readings from Last 3 Encounters:  12/06/23 132/82  07/06/23 107/60  06/02/23 121/74   Wt Readings from Last 10 Encounters:  12/06/23 177 lb 3.2 oz (80.4 kg)  07/06/23 175 lb (79.4 kg)  06/02/23 175 lb 9.6 oz (79.7 kg)  12/03/22 166 lb (75.3 kg)  09/10/22 162 lb (73.5 kg)  07/10/22 157 lb 3.2 oz (71.3 kg)  05/04/22 160 lb (72.6 kg)  03/03/22 162 lb 3.2 oz (73.6 kg)  01/29/22 162 lb 12.8 oz (73.8 kg)   12/29/21 166 lb (75.3 kg)  Physical Exam  Physical Exam HEENT:  NECK:  ABDOMEN: NEUROLOGICAL:   GEN: No acute distress, resting comfortably. HEENT: Scarring on the bottom of eardrums. Cavity in upper left molar. Throat asymmetrical. oropharynx clear, no thyromegaly noted, no palpable lymphadenopathy or thyroid  nodules.Lymph nodes normal size around thyroid . CARDIOVASCULAR: S1 and S2 heart sounds with regular rate and rhythm, no murmurs appreciated. PULMONARY: Normal work of breathing, clear to auscultation bilaterally, no crackles, wheezes, or rhonchi. ABDOMEN: Soft, mostly nontender, nondistended.Tenderness in right upper quadrant. MSK: No edema, cyanosis, or clubbing noted. SKIN: Warm, dry, no lesions of concern observed. NEUROLOGICAL: Cranial nerves II-XII grossly intact, strength 5/5 in upper and lower extremities, reflexes symmetric and intact bilaterally.Hyperreflexia bilaterally. Gait normal. Finger to nose intact. PSYCH: Normal affect and thought content, pleasant and cooperative.  Last CBC Lab Results  Component Value Date   WBC 4.8 12/06/2023   HGB 16.1 12/06/2023   HCT 47.6 12/06/2023   MCV 97.2 12/06/2023   RDW 13.3 12/06/2023   PLT 259.0 12/06/2023   Last metabolic panel Lab Results  Component Value Date   GLUCOSE 101 (H) 12/06/2023   NA 140 12/06/2023   K 4.5 12/06/2023   CL 103 12/06/2023   CO2 31 12/06/2023   BUN 11 12/06/2023   CREATININE 1.30 12/06/2023   GFR 66.58 12/06/2023   CALCIUM  10.0 12/06/2023   PROT 7.2 12/06/2023   ALBUMIN 4.8 12/06/2023   BILITOT 0.4 12/06/2023   ALKPHOS 43 12/06/2023   AST 20 12/06/2023   ALT 19 12/06/2023   Last lipids Lab Results  Component Value Date   CHOL 229 (H) 12/06/2023   HDL 49.70 12/06/2023   LDLCALC 161 (H) 12/06/2023   TRIG 93.0 12/06/2023   CHOLHDL 5 12/06/2023   Last hemoglobin A1c No results found for: HGBA1C Last thyroid  functions Lab Results  Component Value Date   TSH 2.450 07/06/2023    Last vitamin D  Lab Results  Component Value Date   VD25OH 59.25 07/06/2023   Last vitamin B12 and Folate Lab Results  Component Value Date   VITAMINB12 595 12/06/2023   FOLATE >25.2 07/06/2023        ======================================  IMPORTANT HEALTH REMINDERS: Report any new or changing skin lesions promptly Maintain recommended screening schedules Discuss any new family history of cancer at future visits Follow up on any new symptoms that persist more than two weeks      Notes:  This document was synthesized by artificial intelligence ([employer]bridge) using HIPAA-compliant recording of the clinical interaction;   We discussed the use of AI scribe software for clinical note transcription with the patient, who gave verbal consent to proceed.    This encounter employed state-of-the-art, real-time, collaborative documentation. The patient was empowered to actively review and assist in updating their electronic medical record on a shared monitor, ensuring transparency and improving accuracy.    Prior to and at the beginning of Comprehensive Physical Exam (CPE) preventive care annual visit appointment types  we clarify to patients Our goal today is to focus on your preventive or annual Comprehensive Physical Exam (CPE) preventive care annual visit, which typically covers routine screenings and  overall health maintenance. However, if you share any new or concerning symptoms--such as dizziness, passing out, severe pain, or anything else that may point to a more serious issue--we are both legally and ethically required to evaluate it. We cannot simply overlook or ignore such concerns, even if you later decide you don't want to discuss them, because it could jeopardize your health.  If addressing a new concern takes us  beyond the scope of the preventive visit, we may need to bill separately for that portion of care. We understand financial considerations are important, and we're happy to  discuss your options if something new comes up. However, we want to be clear that once you mention a potentially serious issue, we must investigate it; we can't ethically or legally exclude that from our records or our evaluation. Please let us  know all of your questions or worries. Together, we can decide how best to manage them and how to minimize any unexpected costs, but we want to keep you safe above all else.   This disclosure is mandated by professional ethics and legal obligations, as healthcare providers must address any substantial health concerns raised during any patient interaction and a comprehensive ROS is required by insurance companies for billing preventive-care visit type.   This disclosure ultimately discourages patients financially from reporting significant health issues.   Medical Screening Exam A medical screening exam (MSE) helps to determine whether you need immediate medical treatment relating to any number of symptoms you are having. This type of exam may be done in an emergency department, an urgent care setting, or your health care provider's office. Depending on your symptoms and severity, you may need additional tests or medical therapy. It is important to note that an MSE does not necessarily mean that you will need or receive further medical testing or interventions if your symptoms are not deemed to be medically urgent (emergent). Tell a health care provider about: Any allergies you have. All medicines you are taking, including vitamins, herbs, eye drops, creams, and over-the-counter medicines. Any problems you or family members have had with anesthetic medicines. Any bleeding problems you have. Any surgeries you have had. Any medical conditions you have. Whether you are pregnant or may be pregnant. What happens during the test? During the exam, a health care provider does a short, often focused, physical exam and asks about your medical history to assess: Your  current symptoms. Your overall health. Your need for possible further medical intervention. What can I expect after the test? If you have a regular health care provider, make an appointment for a follow-up visit with him or her. If you do not have a regular health care provider, ask about resources in your community. Your medical screening exam may determine that: You do not need emergency treatment at this time. You need treatment right away. You need to be transferred to another medical center. This may happen if you need an emergent specialist or consultant that is not available at the medical center you are at. You need to have more tests. A medical specialist may be consulted if needed. Get help right away if: Your condition gets worse. You develop new or troubling symptoms before you see your health care provider. These symptoms may represent a serious problem that is an emergency. Do not wait to see if the symptoms will go away. Get medical help right away. Call your local emergency services (911 in the [place] Do not drive yourself to the hospital. Summary A medical  screening exam helps to determine whether you need medical treatment right away. This type of exam may be done in an emergency department, an urgent care setting, or your health care provider's office. During the exam, a health care provider does a short physical exam and asks about your current symptoms and overall health. Depending on the exam, more tests or therapies may be ordered. However, an MSE does not necessarily mean that you will have further medical testing if your symptoms are not deemed to be urgent. If you need further care that is not offered at your current medical center, you may need to be transferred to another facility. This information is not intended to replace advice given to you by your health care provider. Make sure you discuss any questions you have with your health care provider. Document Revised:  11/27/2020 Document Reviewed: 07/25/2020 Elsevier Patient Education  2024 [employer] Inc[employer]   Health Maintenance, Male Adopting a healthy lifestyle and getting preventive care are important in promoting health and wellness. Ask your health care provider about: The right schedule for you to have regular tests and exams. Things you can do on your own to prevent diseases and keep yourself healthy. What should I know about diet, weight, and exercise? Eat a healthy diet  Eat a diet that includes plenty of vegetables, fruits, low-fat dairy products, and lean protein. Do not eat a lot of foods that are high in solid fats, added sugars, or sodium. Maintain a healthy weight Body mass index (BMI) is a measurement that can be used to identify possible weight problems. It estimates body fat based on height and weight. Your health care provider can help determine your BMI and help you achieve or maintain a healthy weight. Get regular exercise Get regular exercise. This is one of the most important things you can do for your health. Most adults should: Exercise for at least 150 minutes each week. The exercise should increase your heart rate and make you sweat (moderate-intensity exercise). Do strengthening exercises at least twice a week. This is in addition to the moderate-intensity exercise. Spend less time sitting. Even light physical activity can be beneficial. Watch cholesterol and blood lipids Have your blood tested for lipids and cholesterol at 45 years of age, then have this test every 5 years. You may need to have your cholesterol levels checked more often if: Your lipid or cholesterol levels are high. You are older than 45 years of age. You are at high risk for heart disease. What should I know about cancer screening? Many types of cancers can be detected early and may often be prevented. Depending on your health history and family history, you may need to have cancer screening at various ages.  This may include screening for: Colorectal cancer. Prostate cancer. Skin cancer. Lung cancer. What should I know about heart disease, diabetes, and high blood pressure? Blood pressure and heart disease High blood pressure causes heart disease and increases the risk of stroke. This is more likely to develop in people who have high blood pressure readings or are overweight. Talk with your health care provider about your target blood pressure readings. Have your blood pressure checked: Every 3-5 years if you are 25-84 years of age. Every year if you are 61 years old or older. If you are between the ages of 64 and 5 and are a current or former smoker, ask your health care provider if you should have a one-time screening for abdominal aortic aneurysm (AAA). Diabetes Have  regular diabetes screenings. This checks your fasting blood sugar level. Have the screening done: Once every three years after age 32 if you are at a normal weight and have a low risk for diabetes. More often and at a younger age if you are overweight or have a high risk for diabetes. What should I know about preventing infection? Hepatitis B If you have a higher risk for hepatitis B, you should be screened for this virus. Talk with your health care provider to find out if you are at risk for hepatitis B infection. Hepatitis C Blood testing is recommended for: Everyone born from [birth year] through 1967. Anyone with known risk factors for hepatitis C. Sexually transmitted infections (STIs) You should be screened each year for STIs, including gonorrhea and chlamydia, if: You are sexually active and are younger than 45 years of age. You are older than 45 years of age and your health care provider tells you that you are at risk for this type of infection. Your sexual activity has changed since you were last screened, and you are at increased risk for chlamydia or gonorrhea. Ask your health care provider if you are at risk. Ask your  health care provider about whether you are at high risk for HIV. Your health care provider may recommend a prescription medicine to help prevent HIV infection. If you choose to take medicine to prevent HIV, you should first get tested for HIV. You should then be tested every 3 months for as long as you are taking the medicine. Follow these instructions at home: Alcohol use Do not drink alcohol if your health care provider tells you not to drink. If you drink alcohol: Limit how much you have to 0-2 drinks a day. Know how much alcohol is in your drink. In the [place] one drink equals one 12 oz bottle of beer (355 mL), one 5 oz glass of wine (148 mL), or one 1 oz glass of hard liquor (44 mL). Lifestyle Do not use any products that contain nicotine or tobacco. These products include cigarettes, chewing tobacco, and vaping devices, such as e-cigarettes. If you need help quitting, ask your health care provider. Do not use street drugs. Do not share needles. Ask your health care provider for help if you need support or information about quitting drugs. General instructions Schedule regular health, dental, and eye exams. Stay current with your vaccines. Tell your health care provider if: You often feel depressed. You have ever been abused or do not feel safe at home. Summary Adopting a healthy lifestyle and getting preventive care are important in promoting health and wellness. Follow your health care provider's instructions about healthy diet, exercising, and getting tested or screened for diseases. Follow your health care provider's instructions on monitoring your cholesterol and blood pressure. This information is not intended to replace advice given to you by your health care provider. Make sure you discuss any questions you have with your health care provider. Document Revised: 08/05/2020 Document Reviewed: 08/05/2020 Elsevier Patient Education  2024 [employer].

## 2023-12-06 NOTE — Assessment & Plan Note (Signed)
 Routine adult wellness visit with no acute concerns. Discussed general health maintenance, including vaccinations and cancer screenings. He is eligible for the Gardasil vaccine but declines due to a long-term monogamous relationship. Colon cancer screening is complete. A PSA test is indicated due to a family history of prostate cancer. Offer a flu shot and perform the PSA test.

## 2023-12-06 NOTE — Assessment & Plan Note (Signed)
 Bipolar II disorder   Bipolar II disorder is managed with Abilify  and Lexapro . He reports difficulty achieving orgasm, likely a side effect of Lexapro . Discussed alternative medications and adjustments to the current regimen. Prescribe Zoloft  25 mg as an alternative to Lexapro , with a discussion of potential emotional risks. Increase Abilify  to 5 mg to reduce SSRI dependency. Consider tapering off Lexapro  if switching to Zoloft .

## 2023-12-07 LAB — TSH RFX ON ABNORMAL TO FREE T4: TSH: 2.52 u[IU]/mL (ref 0.450–4.500)

## 2023-12-10 LAB — METHYLMALONIC ACID, SERUM: Methylmalonic Acid, Quant: 90 nmol/L (ref 55–335)

## 2023-12-11 ENCOUNTER — Ambulatory Visit: Payer: Self-pay | Admitting: Internal Medicine

## 2023-12-11 NOTE — Progress Notes (Signed)
 Reviewed, all essentially normal except cholesterol.  Message shared to patient.

## 2023-12-13 DIAGNOSIS — Z1211 Encounter for screening for malignant neoplasm of colon: Secondary | ICD-10-CM | POA: Diagnosis not present

## 2023-12-13 DIAGNOSIS — R739 Hyperglycemia, unspecified: Secondary | ICD-10-CM | POA: Diagnosis not present

## 2023-12-13 DIAGNOSIS — K5190 Ulcerative colitis, unspecified, without complications: Secondary | ICD-10-CM | POA: Diagnosis not present

## 2023-12-13 DIAGNOSIS — M6208 Separation of muscle (nontraumatic), other site: Secondary | ICD-10-CM | POA: Diagnosis not present

## 2023-12-23 NOTE — Progress Notes (Signed)
 This encounter was created in error - please disregard.

## 2024-01-13 DIAGNOSIS — Z1211 Encounter for screening for malignant neoplasm of colon: Secondary | ICD-10-CM | POA: Diagnosis not present

## 2024-01-13 DIAGNOSIS — K5190 Ulcerative colitis, unspecified, without complications: Secondary | ICD-10-CM | POA: Diagnosis not present

## 2024-01-13 LAB — HM COLONOSCOPY

## 2024-01-18 ENCOUNTER — Ambulatory Visit: Payer: Self-pay | Admitting: Internal Medicine

## 2024-01-18 NOTE — Progress Notes (Signed)
  CLINICAL FINDINGS/SPECIAL REQUESTS: Ulcerative colitis. Inflammation was not found on today's exam (based on the endoscopic appearance of the mucosa). This was graded as Mayo Score O (normat or inactive disease), improved compared to previous examinations. Biopsied. In the cecum and in the ascending colon the Mayo Score was 1, but the rest of the colon appeared to be a Mayo Score of O. Rule out dysplasia. Personal history of Ulcerative colitis- FINAL DIAGNOSIS: Colon, Right, Random Biopsy: - CHRONIC COLITIS, MILDLY ACTIVE. See comment. No dysplasia or malignancy identified. Colon, Left, Random Biopsy: - FOCAL ACTIVE COLITIS. See comment. - No dysplasia or malignancy identified. Comment: The findings are consistent with the patient's history of ulcerative colitis. No granulomas, dysplasia, or malignancy are identified in any of the biopsies.

## 2024-02-02 DIAGNOSIS — R194 Change in bowel habit: Secondary | ICD-10-CM | POA: Diagnosis not present

## 2024-02-02 DIAGNOSIS — R197 Diarrhea, unspecified: Secondary | ICD-10-CM | POA: Diagnosis not present

## 2024-02-03 DIAGNOSIS — R197 Diarrhea, unspecified: Secondary | ICD-10-CM | POA: Diagnosis not present

## 2024-02-19 ENCOUNTER — Other Ambulatory Visit: Payer: Self-pay | Admitting: Internal Medicine

## 2024-02-19 DIAGNOSIS — F3181 Bipolar II disorder: Secondary | ICD-10-CM

## 2024-02-19 DIAGNOSIS — F1091 Alcohol use, unspecified, in remission: Secondary | ICD-10-CM

## 2024-04-06 ENCOUNTER — Other Ambulatory Visit: Payer: Self-pay | Admitting: Internal Medicine

## 2024-04-06 DIAGNOSIS — F3181 Bipolar II disorder: Secondary | ICD-10-CM

## 2024-12-06 ENCOUNTER — Encounter: Admitting: Internal Medicine
